# Patient Record
Sex: Female | Born: 1965
Health system: Southern US, Community
[De-identification: ages and names within clinical notes are randomized; demographics above are authoritative.]

## PROBLEM LIST (undated history)

## (undated) DIAGNOSIS — B9689 Other specified bacterial agents as the cause of diseases classified elsewhere: Secondary | ICD-10-CM

## (undated) DIAGNOSIS — R42 Dizziness and giddiness: Secondary | ICD-10-CM

## (undated) DIAGNOSIS — M797 Fibromyalgia: Secondary | ICD-10-CM

## (undated) DIAGNOSIS — Z79899 Other long term (current) drug therapy: Secondary | ICD-10-CM

## (undated) DIAGNOSIS — J45909 Unspecified asthma, uncomplicated: Secondary | ICD-10-CM

## (undated) DIAGNOSIS — Z8619 Personal history of other infectious and parasitic diseases: Secondary | ICD-10-CM

## (undated) DIAGNOSIS — R1031 Right lower quadrant pain: Secondary | ICD-10-CM

## (undated) DIAGNOSIS — N760 Acute vaginitis: Secondary | ICD-10-CM

## (undated) DIAGNOSIS — E039 Hypothyroidism, unspecified: Secondary | ICD-10-CM

## (undated) DIAGNOSIS — N898 Other specified noninflammatory disorders of vagina: Secondary | ICD-10-CM

## (undated) DIAGNOSIS — E559 Vitamin D deficiency, unspecified: Secondary | ICD-10-CM

## (undated) DIAGNOSIS — K5900 Constipation, unspecified: Secondary | ICD-10-CM

## (undated) DIAGNOSIS — R319 Hematuria, unspecified: Secondary | ICD-10-CM

## (undated) DIAGNOSIS — R300 Dysuria: Secondary | ICD-10-CM

## (undated) DIAGNOSIS — N6009 Solitary cyst of unspecified breast: Secondary | ICD-10-CM

## (undated) DIAGNOSIS — K589 Irritable bowel syndrome without diarrhea: Secondary | ICD-10-CM

## (undated) DIAGNOSIS — T7840XA Allergy, unspecified, initial encounter: Secondary | ICD-10-CM

## (undated) DIAGNOSIS — B001 Herpesviral vesicular dermatitis: Secondary | ICD-10-CM

## (undated) DIAGNOSIS — D219 Benign neoplasm of connective and other soft tissue, unspecified: Secondary | ICD-10-CM

## (undated) DIAGNOSIS — B379 Candidiasis, unspecified: Secondary | ICD-10-CM

## (undated) DIAGNOSIS — R350 Frequency of micturition: Secondary | ICD-10-CM

## (undated) DIAGNOSIS — R102 Pelvic and perineal pain: Secondary | ICD-10-CM

## (undated) DIAGNOSIS — F419 Anxiety disorder, unspecified: Secondary | ICD-10-CM

## (undated) HISTORY — PX: TUBAL LIGATION: SHX77

## (undated) HISTORY — DX: Frequency of micturition: R35.0

## (undated) HISTORY — DX: Unspecified asthma, uncomplicated: J45.909

## (undated) HISTORY — DX: Right lower quadrant pain: R10.31

## (undated) HISTORY — DX: Irritable bowel syndrome, unspecified: K58.9

## (undated) HISTORY — DX: Acute vaginitis: N76.0

## (undated) HISTORY — DX: Anxiety disorder, unspecified: F41.9

## (undated) HISTORY — DX: Hematuria, unspecified: R31.9

## (undated) HISTORY — DX: Other specified bacterial agents as the cause of diseases classified elsewhere: B96.89

## (undated) HISTORY — DX: Personal history of other infectious and parasitic diseases: Z86.19

## (undated) HISTORY — DX: Dysuria: R30.0

## (undated) HISTORY — DX: Allergy, unspecified, initial encounter: T78.40XA

## (undated) HISTORY — DX: Benign neoplasm of connective and other soft tissue, unspecified: D21.9

## (undated) HISTORY — DX: Hypothyroidism, unspecified: E03.9

## (undated) HISTORY — DX: Herpesviral vesicular dermatitis: B00.1

## (undated) HISTORY — DX: Vitamin D deficiency, unspecified: E55.9

## (undated) HISTORY — PX: TONSILLECTOMY: SUR1361

## (undated) HISTORY — DX: Dizziness and giddiness: R42

## (undated) HISTORY — DX: Constipation, unspecified: K59.00

## (undated) HISTORY — DX: Candidiasis, unspecified: B37.9

## (undated) HISTORY — DX: Pelvic and perineal pain: R10.2

## (undated) HISTORY — DX: Fibromyalgia: M79.7

## (undated) HISTORY — DX: Solitary cyst of unspecified breast: N60.09

## (undated) HISTORY — DX: Other specified noninflammatory disorders of vagina: N89.8

## (undated) HISTORY — PX: ABDOMINAL HYSTERECTOMY: SHX81

## (undated) HISTORY — DX: Other long term (current) drug therapy: Z79.899

---

## 2000-11-13 ENCOUNTER — Other Ambulatory Visit: Admission: RE | Admit: 2000-11-13 | Discharge: 2000-11-13 | Payer: Self-pay | Admitting: Obstetrics and Gynecology

## 2001-07-09 ENCOUNTER — Encounter: Payer: Self-pay | Admitting: Obstetrics and Gynecology

## 2001-07-09 ENCOUNTER — Ambulatory Visit (HOSPITAL_COMMUNITY): Admission: RE | Admit: 2001-07-09 | Discharge: 2001-07-09 | Payer: Self-pay | Admitting: Internal Medicine

## 2003-01-27 ENCOUNTER — Ambulatory Visit (HOSPITAL_COMMUNITY): Admission: RE | Admit: 2003-01-27 | Discharge: 2003-01-27 | Payer: Self-pay | Admitting: Obstetrics and Gynecology

## 2005-11-29 ENCOUNTER — Ambulatory Visit (HOSPITAL_COMMUNITY): Admission: RE | Admit: 2005-11-29 | Discharge: 2005-11-29 | Payer: Self-pay | Admitting: Obstetrics and Gynecology

## 2007-02-27 ENCOUNTER — Other Ambulatory Visit: Admission: RE | Admit: 2007-02-27 | Discharge: 2007-02-27 | Payer: Self-pay | Admitting: Obstetrics and Gynecology

## 2008-07-22 ENCOUNTER — Encounter: Admission: RE | Admit: 2008-07-22 | Discharge: 2008-07-22 | Payer: Self-pay | Admitting: Obstetrics & Gynecology

## 2008-08-30 ENCOUNTER — Emergency Department (HOSPITAL_COMMUNITY): Admission: EM | Admit: 2008-08-30 | Discharge: 2008-08-30 | Payer: Self-pay | Admitting: Emergency Medicine

## 2009-05-12 ENCOUNTER — Encounter: Admission: RE | Admit: 2009-05-12 | Discharge: 2009-05-12 | Payer: Self-pay | Admitting: Obstetrics & Gynecology

## 2009-08-19 ENCOUNTER — Telehealth (INDEPENDENT_AMBULATORY_CARE_PROVIDER_SITE_OTHER): Payer: Self-pay | Admitting: *Deleted

## 2009-09-23 ENCOUNTER — Emergency Department: Payer: Self-pay | Admitting: Emergency Medicine

## 2009-10-25 ENCOUNTER — Ambulatory Visit (HOSPITAL_COMMUNITY): Admission: RE | Admit: 2009-10-25 | Discharge: 2009-10-25 | Payer: Self-pay | Admitting: Obstetrics & Gynecology

## 2009-11-24 ENCOUNTER — Ambulatory Visit (HOSPITAL_COMMUNITY): Admission: RE | Admit: 2009-11-24 | Discharge: 2009-11-24 | Payer: Self-pay | Admitting: Obstetrics & Gynecology

## 2010-03-08 ENCOUNTER — Encounter (HOSPITAL_COMMUNITY): Admission: RE | Admit: 2010-03-08 | Discharge: 2010-04-07 | Payer: Self-pay | Admitting: Preventative Medicine

## 2010-06-14 NOTE — Progress Notes (Signed)
  Phone Note Call from Patient   Caller: Mary Oconnell Summary of Call: patient called yesterday 08/18/09  to set up appt. She has never been seen in our office due to 3 cancellations and 1 no show.Marland Kitchen l called patient back today and left her a message explaining to her that she needs to speak to her pcp to see if she needs this appt still and if so that we would need another referrall from him.  Initial call taken by: ginger urshel

## 2010-08-24 LAB — COMPREHENSIVE METABOLIC PANEL
ALT: 10 U/L (ref 0–35)
AST: 12 U/L (ref 0–37)
Albumin: 4 g/dL (ref 3.5–5.2)
Alkaline Phosphatase: 58 U/L (ref 39–117)
BUN: 18 mg/dL (ref 6–23)
CO2: 24 mEq/L (ref 19–32)
Calcium: 9.8 mg/dL (ref 8.4–10.5)
Chloride: 107 mEq/L (ref 96–112)
Creatinine, Ser: 0.88 mg/dL (ref 0.4–1.2)
GFR calc Af Amer: >60 mL/min (ref >60)
Glucose, Bld: 117 mg/dL — ABNORMAL HIGH (ref 70–99)

## 2010-08-24 LAB — CBC
HCT: 42.5 % (ref 36.0–46.0)
MCHC: 35.4 g/dL (ref 30.0–36.0)
RDW: 12.4 % (ref 11.5–15.5)
WBC: 7.7 10*3/uL (ref 4.0–10.5)

## 2010-08-24 LAB — DIFFERENTIAL
Basophils Absolute: 0 10*3/uL (ref 0.0–0.1)
Eosinophils Absolute: 0.1 10*3/uL (ref 0.0–0.7)
Lymphs Abs: 2.5 10*3/uL (ref 0.7–4.0)
Monocytes Relative: 10 % (ref 3–12)
Neutro Abs: 4.2 10*3/uL (ref 1.7–7.7)
Neutrophils Relative %: 55 % (ref 43–77)

## 2010-08-29 ENCOUNTER — Other Ambulatory Visit (HOSPITAL_COMMUNITY): Payer: Self-pay | Admitting: Pediatrics

## 2010-08-29 DIAGNOSIS — R1011 Right upper quadrant pain: Secondary | ICD-10-CM

## 2010-08-30 ENCOUNTER — Ambulatory Visit (HOSPITAL_COMMUNITY)
Admission: RE | Admit: 2010-08-30 | Discharge: 2010-08-30 | Disposition: A | Discharge status: Home / Self Care | Payer: BC Managed Care – PPO | Source: Ambulatory Visit | Attending: Pediatrics | Admitting: Pediatrics

## 2010-08-30 DIAGNOSIS — R1011 Right upper quadrant pain: Secondary | ICD-10-CM

## 2010-12-26 ENCOUNTER — Other Ambulatory Visit: Payer: Self-pay | Admitting: Adult Health

## 2010-12-26 DIAGNOSIS — Z1231 Encounter for screening mammogram for malignant neoplasm of breast: Secondary | ICD-10-CM

## 2010-12-30 ENCOUNTER — Ambulatory Visit
Admission: RE | Admit: 2010-12-30 | Discharge: 2010-12-30 | Disposition: A | Discharge status: Home / Self Care | Payer: BC Managed Care – PPO | Source: Ambulatory Visit | Attending: Adult Health | Admitting: Adult Health

## 2010-12-30 DIAGNOSIS — Z1231 Encounter for screening mammogram for malignant neoplasm of breast: Secondary | ICD-10-CM

## 2011-01-04 ENCOUNTER — Other Ambulatory Visit: Payer: Self-pay | Admitting: Adult Health

## 2011-01-04 DIAGNOSIS — R928 Other abnormal and inconclusive findings on diagnostic imaging of breast: Secondary | ICD-10-CM

## 2011-01-05 ENCOUNTER — Ambulatory Visit: Payer: BC Managed Care – PPO

## 2011-01-11 ENCOUNTER — Ambulatory Visit
Admission: RE | Admit: 2011-01-11 | Discharge: 2011-01-11 | Disposition: A | Discharge status: Home / Self Care | Payer: BC Managed Care – PPO | Source: Ambulatory Visit | Attending: Adult Health | Admitting: Adult Health

## 2011-01-11 DIAGNOSIS — R928 Other abnormal and inconclusive findings on diagnostic imaging of breast: Secondary | ICD-10-CM

## 2011-03-09 ENCOUNTER — Other Ambulatory Visit: Payer: Self-pay | Admitting: Obstetrics & Gynecology

## 2011-03-09 DIAGNOSIS — N6009 Solitary cyst of unspecified breast: Secondary | ICD-10-CM

## 2011-03-22 ENCOUNTER — Ambulatory Visit
Admission: RE | Admit: 2011-03-22 | Discharge: 2011-03-22 | Disposition: A | Discharge status: Home / Self Care | Payer: BC Managed Care – PPO | Source: Ambulatory Visit | Attending: Obstetrics & Gynecology | Admitting: Obstetrics & Gynecology

## 2011-03-22 DIAGNOSIS — N6009 Solitary cyst of unspecified breast: Secondary | ICD-10-CM

## 2011-12-13 ENCOUNTER — Other Ambulatory Visit: Payer: Self-pay | Admitting: Obstetrics and Gynecology

## 2011-12-13 DIAGNOSIS — Z1231 Encounter for screening mammogram for malignant neoplasm of breast: Secondary | ICD-10-CM

## 2012-01-01 ENCOUNTER — Ambulatory Visit: Payer: BC Managed Care – PPO

## 2012-07-10 ENCOUNTER — Other Ambulatory Visit: Payer: Self-pay

## 2012-07-10 DIAGNOSIS — Z1231 Encounter for screening mammogram for malignant neoplasm of breast: Secondary | ICD-10-CM

## 2012-07-22 ENCOUNTER — Ambulatory Visit
Admission: RE | Admit: 2012-07-22 | Discharge: 2012-07-22 | Disposition: A | Discharge status: Home / Self Care | Payer: BC Managed Care – PPO | Source: Ambulatory Visit

## 2012-07-22 DIAGNOSIS — Z1231 Encounter for screening mammogram for malignant neoplasm of breast: Secondary | ICD-10-CM

## 2012-08-13 ENCOUNTER — Other Ambulatory Visit: Payer: Self-pay | Admitting: Obstetrics & Gynecology

## 2012-08-13 MED ORDER — LORAZEPAM 0.5 MG PO TABS: 0.5000 mg | ORAL_TABLET | Freq: Three times a day (TID) | ORAL | Status: DC

## 2012-08-13 NOTE — Telephone Encounter (Signed)
Patient requested a refill on her Ativan, appproved

## 2012-08-26 ENCOUNTER — Encounter: Payer: Self-pay | Admitting: Adult Health

## 2012-08-26 ENCOUNTER — Ambulatory Visit (INDEPENDENT_AMBULATORY_CARE_PROVIDER_SITE_OTHER): Payer: BC Managed Care – PPO | Admitting: Adult Health

## 2012-08-26 VITALS — BP 110/66 | Ht 65.0 in | Wt 135.0 lb

## 2012-08-26 DIAGNOSIS — F411 Generalized anxiety disorder: Secondary | ICD-10-CM

## 2012-08-26 DIAGNOSIS — N63 Unspecified lump in unspecified breast: Secondary | ICD-10-CM

## 2012-08-26 DIAGNOSIS — Z9223 Personal history of estrogen therapy: Secondary | ICD-10-CM

## 2012-08-26 DIAGNOSIS — F419 Anxiety disorder, unspecified: Secondary | ICD-10-CM

## 2012-08-26 NOTE — Progress Notes (Signed)
Subjective:     Patient ID: Mary Oconnell, female   DOB: 12/29/1965, 47 y.o.   MRN: [id number]  Mary Oconnell is a 47 year old white female in complaining of right breast mass.   Review of SystemsPt. Complains of right breast mass and nodule under arm. Reviewed past medical, surgical, social and family history. Reviewed medications and allergies. Taking ativan, doing ok. Using estrogen patches. Had mammogram in March was fine then.     Objective:   Physical Exam  Blood pressure 110/66, height 5\' 5"  (1.651 m), weight 135 lb (61.236 kg).  Skin warm and dry and tan. Left breast: No dominant mass, retraction or nipple discharge. Right breast: No retraction no nipple discharge but there is a smooth mobile nodule at 12:00 2 fingerbreadths from nipple, it was tender with palpation. She also has an area under her right arm that's tender, questionable lymph node.    Assessment:      Right breast nodule   Anxiety Estrogen therapy Plan:      Scheduled diagnostic right mammogram and right breast ultrasound  4/25 at 9:50 am at the [facility] in [place]. Will follow up by phone after tests, leave breast alone.

## 2012-08-26 NOTE — Patient Instructions (Addendum)
Breast ultrasound and mammogram 4 /25@ 9:50 adm at breast center in [place]. Leave breast alone  Sign up for my chart

## 2012-09-06 ENCOUNTER — Ambulatory Visit
Admission: RE | Admit: 2012-09-06 | Discharge: 2012-09-06 | Disposition: A | Discharge status: Home / Self Care | Payer: BC Managed Care – PPO | Source: Ambulatory Visit | Attending: Adult Health | Admitting: Adult Health

## 2012-09-06 ENCOUNTER — Other Ambulatory Visit: Payer: Self-pay | Admitting: Adult Health

## 2012-09-06 DIAGNOSIS — N63 Unspecified lump in unspecified breast: Secondary | ICD-10-CM

## 2012-09-18 ENCOUNTER — Other Ambulatory Visit: Payer: Self-pay | Admitting: *Deleted

## 2012-09-18 MED ORDER — ESTRADIOL 0.1 MG/24HR TD PTTW: 1.0000 | MEDICATED_PATCH | TRANSDERMAL | Status: DC

## 2012-10-17 ENCOUNTER — Encounter: Payer: Self-pay | Admitting: *Deleted

## 2012-10-18 ENCOUNTER — Ambulatory Visit: Payer: BC Managed Care – PPO | Admitting: Adult Health

## 2012-11-01 ENCOUNTER — Telehealth: Payer: Self-pay | Admitting: Adult Health

## 2012-11-01 MED ORDER — FLUCONAZOLE 150 MG PO TABS: 150.0000 mg | ORAL_TABLET | Freq: Once | ORAL | Status: DC

## 2012-11-01 NOTE — Telephone Encounter (Signed)
Complains of yeast will call diflucan in to [facility]

## 2012-11-04 ENCOUNTER — Ambulatory Visit: Payer: BC Managed Care – PPO | Admitting: Adult Health

## 2012-12-27 ENCOUNTER — Other Ambulatory Visit: Payer: Self-pay | Admitting: Radiology

## 2012-12-27 ENCOUNTER — Encounter (HOSPITAL_COMMUNITY): Payer: Self-pay

## 2012-12-27 ENCOUNTER — Encounter (HOSPITAL_COMMUNITY)
Admission: RE | Admit: 2012-12-27 | Discharge: 2012-12-27 | Disposition: A | Discharge status: Home / Self Care | Payer: BC Managed Care – PPO | Source: Ambulatory Visit | Attending: Orthopaedic Surgery | Admitting: Orthopaedic Surgery

## 2012-12-27 DIAGNOSIS — Z01818 Encounter for other preprocedural examination: Secondary | ICD-10-CM | POA: Insufficient documentation

## 2012-12-27 DIAGNOSIS — Z01812 Encounter for preprocedural laboratory examination: Secondary | ICD-10-CM | POA: Insufficient documentation

## 2012-12-27 LAB — CBC WITH DIFFERENTIAL/PLATELET
Hemoglobin: 15.2 g/dL — ABNORMAL HIGH (ref 12.0–15.0)
Lymphs Abs: 2.1 10*3/uL (ref 0.7–4.0)
Monocytes Relative: 13 % — ABNORMAL HIGH (ref 3–12)
Neutro Abs: 2.2 10*3/uL (ref 1.7–7.7)
Neutrophils Relative %: 44 % (ref 43–77)
RBC: 4.71 MIL/uL (ref 3.87–5.11)

## 2012-12-27 LAB — URINALYSIS, ROUTINE W REFLEX MICROSCOPIC
Glucose, UA: NEGATIVE mg/dL
Ketones, ur: NEGATIVE mg/dL
Leukocytes, UA: NEGATIVE
Protein, ur: NEGATIVE mg/dL

## 2012-12-27 LAB — COMPREHENSIVE METABOLIC PANEL
Albumin: 3.9 g/dL (ref 3.5–5.2)
Alkaline Phosphatase: 81 U/L (ref 39–117)
BUN: 15 mg/dL (ref 6–23)
Chloride: 105 mEq/L (ref 96–112)
GFR calc Af Amer: 79 mL/min — ABNORMAL LOW (ref >90)
Glucose, Bld: 94 mg/dL (ref 70–99)
Potassium: 4.7 mEq/L (ref 3.5–5.1)
Total Bilirubin: 0.4 mg/dL (ref 0.3–1.2)

## 2012-12-27 NOTE — Patient Instructions (Addendum)
Mary Oconnell  12/27/2012   Your procedure is scheduled on:  12/31/2012  Report to [facility] at  800  AM.  Call this number if you have problems the morning of surgery: [phone]   Remember:   Do not eat food or drink liquids after midnight.   Take these medicines the morning of surgery with A SIP OF WATER: ativan. Take your flonase and your inhaler before you come.   Do not wear jewelry, make-up or nail polish.  Do not wear lotions, powders, or perfumes.   Do not shave 48 hours prior to surgery. Men may shave face and neck.  Do not bring valuables to the hospital.  [facility] is not responsible for any belongings or valuables.  Contacts, dentures or bridgework may not be worn into surgery.  Leave suitcase in the car. After surgery it may be brought to your room.  For patients admitted to the hospital, checkout time is 11:00 AM the day of discharge.   Patients discharged the day of surgery will not be allowed to drive home.  Name and phone number of your driver: family  Special Instructions: Shower using CHG 2 nights before surgery and the night before surgery.  If you shower the day of surgery use CHG.  Use special wash - you have one bottle of CHG for all showers.  You should use approximately 1/3 of the bottle for each shower.   Please read over the following fact sheets that you were given: Pain Booklet, Coughing and Deep Breathing, Surgical Site Infection Prevention, Anesthesia Post-op Instructions and Care and Recovery After Surgery Carpal Tunnel Release Carpal tunnel release is done to relieve the pressure on the nerves and tendons on the bottom side of your wrist.  LET YOUR CAREGIVER KNOW ABOUT:   Allergies to food or medicine.  Medicines taken, including vitamins, herbs, eyedrops, over-the-counter medicines, and creams.  Use of steroids (by mouth or creams).  Previous problems with anesthetics or numbing medicines.  History of bleeding problems or blood  clots.  Previous surgery.  Other health problems, including diabetes and kidney problems.  Possibility of pregnancy, if this applies. RISKS AND COMPLICATIONS  Some problems that may happen after this procedure include:  Infection.  Damage to the nerves, arteries or tendons could occur. This would be very uncommon.  Bleeding. BEFORE THE PROCEDURE   This surgery may be done while you are asleep (general anesthetic) or may be done under a block where only your forearm and the surgical area is numb.  If the surgery is done under a block, the numbness will gradually wear off within several hours after surgery. HOME CARE INSTRUCTIONS   Have a responsible person with you for 24 hours.  Do not drive a car or use public transportation for 24 hours.  Only take over-the-counter or prescription medicines for pain, discomfort, or fever as directed by your caregiver. Take them as directed.  You may put ice on the palm side of the affected wrist.  Put ice in a plastic bag.  Place a towel between your skin and the bag.  Leave the ice on for 20 to 30 minutes, 4 times per day.  If you were given a splint to keep your wrist from bending, use it as directed. It is important to wear the splint at night or as directed. Use the splint for as long as you have pain or numbness in your hand, arm, or wrist. This may take  1 to 2 months.  Keep your hand raised (elevated) above the level of your heart as much as possible. This keeps swelling down and helps with discomfort.  Change bandages (dressings) as directed.  Keep the wound clean and dry. SEEK MEDICAL CARE IF:   You develop pain not relieved with medications.  You develop numbness of your hand.  You develop bleeding from your surgical site.  You have an oral temperature above 102 F (38.9 C).  You develop redness or swelling of the surgical site.  You develop new, unexplained problems. SEEK IMMEDIATE MEDICAL CARE IF:   You develop  a rash.  You have difficulty breathing.  You develop any reaction or side effects to medications given. Document Released: 07/22/2003 Document Revised: 07/24/2011 Document Reviewed: 03/07/2007 [facility] Patient Information 2014 [place], [place]. PATIENT INSTRUCTIONS POST-ANESTHESIA  IMMEDIATELY FOLLOWING SURGERY:  Do not drive or operate machinery for the first twenty four hours after surgery.  Do not make any important decisions for twenty four hours after surgery or while taking narcotic pain medications or sedatives.  If you develop intractable nausea and vomiting or a severe headache please notify your doctor immediately.  FOLLOW-UP:  Please make an appointment with your surgeon as instructed. You do not need to follow up with anesthesia unless specifically instructed to do so.  WOUND CARE INSTRUCTIONS (if applicable):  Keep a dry clean dressing on the anesthesia/puncture wound site if there is drainage.  Once the wound has quit draining you may leave it open to air.  Generally you should leave the bandage intact for twenty four hours unless there is drainage.  If the epidural site drains for more than 36-48 hours please call the anesthesia department.  QUESTIONS?:  Please feel free to call your physician or the hospital operator if you have any questions, and they will be happy to assist you.

## 2012-12-30 ENCOUNTER — Encounter (HOSPITAL_COMMUNITY): Payer: Self-pay | Admitting: Pharmacy Technician

## 2012-12-30 NOTE — H&P (Signed)
Mary Oconnell is an 47 y.o. female.   Chief Complaint: carpal tunnel syndrome left HPI: She has had several month history of pain of both hands, more on the left, of getting numb and tender, more at night.  It involves the long and index fingers and thumb. She has no trauma.  She is not getting better.  She had positive EMG by Dr. Gerilyn Pilgrim showing moderate bilateral median neuropathy of the wrists.  She also has mild underlying demyelinating sensory neuropathy.  I have recommended carpal tunnel release.  I have told her the underlying demyelination condition will not be helped or changed by the procedure.  She understands this is an elective procedure.  She asked appropriate questions.  She has chosen August 19th to have outpatient surgery.  Past Medical History  Diagnosis Date  . Asthma   . Allergy   . Anxiety   . Fibromyalgia   . IBS (irritable bowel syndrome)   . Breast cyst   . Fibroid     Past Surgical History  Procedure Laterality Date  . Abdominal hysterectomy    . Tonsillectomy    . Tubal ligation      Family History  Problem Relation Age of Onset  . Thyroid disease Mother   . Cancer Father     lung  . Cancer Maternal Grandmother 40    breast   . Crohn's disease Sister    Social History:  reports that she has quit smoking. Her smoking use included Cigarettes. She has a 2.5 pack-year smoking history. She quit smokeless tobacco use about 18 years ago. She reports that she does not drink alcohol or use illicit drugs.  Allergies:  Allergies  Allergen Reactions  . Iodine Anaphylaxis  . Shellfish Allergy Anaphylaxis  . Ciprofloxacin Other (See Comments)    Unknown Reaction   . Other Other (See Comments)    Walnuts make patients lips turn blue.  Doylene Bode [Sulfamethoxazole W-Trimethoprim] Hives  . Latex Rash  . Synthroid [Levothyroxine Sodium] Palpitations    No prescriptions prior to admission    No results found for this or any previous visit (from the past 48  hour(s)). No results found.  Review of Systems  Musculoskeletal: Positive for joint pain (Pain both wrists with decreased sensation and night paresthesias.).  All other systems reviewed and are negative.    There were no vitals taken for this visit. Physical Exam  Constitutional: She is oriented to person, place, and time. She appears well-developed and well-nourished.  HENT:  Head: Normocephalic and atraumatic.  Eyes: EOM are normal. Pupils are equal, round, and reactive to light.  Neck: Normal range of motion. Neck supple.  Cardiovascular: Normal rate, regular rhythm, normal heart sounds and intact distal pulses.   Respiratory: Effort normal and breath sounds normal.  GI: Soft. Bowel sounds are normal.  Musculoskeletal: She exhibits tenderness (Both wrists with volar tenderness and positive Tinel and Phalen signs.  Left more than the right.).  Neurological: She is alert and oriented to person, place, and time. She has normal reflexes.  Skin: Skin is warm and dry.  Psychiatric: She has a normal mood and affect. Her behavior is normal. Judgment and thought content normal.     Assessment/Plan Carpal tunnel syndrome, bilaterally, worse on the left than the right.  To do open left carpal tunnel release.  Christinna Sprung 12/30/2012, 1:48 PM

## 2012-12-31 ENCOUNTER — Encounter (HOSPITAL_COMMUNITY): Payer: Self-pay | Admitting: Anesthesiology

## 2012-12-31 ENCOUNTER — Encounter (HOSPITAL_COMMUNITY): Payer: Self-pay | Admitting: *Deleted

## 2012-12-31 ENCOUNTER — Ambulatory Visit (HOSPITAL_COMMUNITY): Payer: BC Managed Care – PPO | Admitting: Anesthesiology

## 2012-12-31 ENCOUNTER — Ambulatory Visit (HOSPITAL_COMMUNITY)
Admission: RE | Admit: 2012-12-31 | Discharge: 2012-12-31 | Disposition: A | Discharge status: Home / Self Care | Payer: BC Managed Care – PPO | Source: Ambulatory Visit | Attending: Orthopaedic Surgery | Admitting: Orthopaedic Surgery

## 2012-12-31 ENCOUNTER — Encounter (HOSPITAL_COMMUNITY)
Admission: RE | Disposition: A | Discharge status: Home / Self Care | Payer: Self-pay | Source: Ambulatory Visit | Attending: Orthopaedic Surgery

## 2012-12-31 DIAGNOSIS — Z91041 Radiographic dye allergy status: Secondary | ICD-10-CM | POA: Insufficient documentation

## 2012-12-31 DIAGNOSIS — Z91040 Latex allergy status: Secondary | ICD-10-CM | POA: Insufficient documentation

## 2012-12-31 DIAGNOSIS — G5600 Carpal tunnel syndrome, unspecified upper limb: Secondary | ICD-10-CM | POA: Insufficient documentation

## 2012-12-31 DIAGNOSIS — Z87891 Personal history of nicotine dependence: Secondary | ICD-10-CM | POA: Insufficient documentation

## 2012-12-31 DIAGNOSIS — Z882 Allergy status to sulfonamides status: Secondary | ICD-10-CM | POA: Insufficient documentation

## 2012-12-31 DIAGNOSIS — N6009 Solitary cyst of unspecified breast: Secondary | ICD-10-CM | POA: Insufficient documentation

## 2012-12-31 DIAGNOSIS — Z91018 Allergy to other foods: Secondary | ICD-10-CM | POA: Insufficient documentation

## 2012-12-31 DIAGNOSIS — Z888 Allergy status to other drugs, medicaments and biological substances status: Secondary | ICD-10-CM | POA: Insufficient documentation

## 2012-12-31 DIAGNOSIS — J45909 Unspecified asthma, uncomplicated: Secondary | ICD-10-CM | POA: Insufficient documentation

## 2012-12-31 DIAGNOSIS — Z883 Allergy status to other anti-infective agents status: Secondary | ICD-10-CM | POA: Insufficient documentation

## 2012-12-31 DIAGNOSIS — K589 Irritable bowel syndrome without diarrhea: Secondary | ICD-10-CM | POA: Insufficient documentation

## 2012-12-31 DIAGNOSIS — IMO0001 Reserved for inherently not codable concepts without codable children: Secondary | ICD-10-CM | POA: Insufficient documentation

## 2012-12-31 DIAGNOSIS — F411 Generalized anxiety disorder: Secondary | ICD-10-CM | POA: Insufficient documentation

## 2012-12-31 DIAGNOSIS — Z91013 Allergy to seafood: Secondary | ICD-10-CM | POA: Insufficient documentation

## 2012-12-31 HISTORY — PX: CARPAL TUNNEL RELEASE: SHX101

## 2012-12-31 SURGERY — CARPAL TUNNEL RELEASE
Anesthesia: Monitor Anesthesia Care | Site: Wrist | Laterality: Left | Wound class: Clean

## 2012-12-31 MED ORDER — PROPOFOL 10 MG/ML IV EMUL
INTRAVENOUS | Status: AC
  Filled 2012-12-31: qty 20

## 2012-12-31 MED ORDER — ONDANSETRON HCL 4 MG/2ML IJ SOLN
4.0000 mg | Freq: Once | INTRAMUSCULAR | Status: AC
  Administered 2012-12-31: 4 mg via INTRAVENOUS

## 2012-12-31 MED ORDER — ONDANSETRON HCL 4 MG/2ML IJ SOLN
INTRAMUSCULAR | Status: AC
  Filled 2012-12-31: qty 2

## 2012-12-31 MED ORDER — FENTANYL CITRATE 0.05 MG/ML IJ SOLN
INTRAMUSCULAR | Status: AC
  Filled 2012-12-31: qty 2

## 2012-12-31 MED ORDER — FENTANYL CITRATE 0.05 MG/ML IJ SOLN
25.0000 ug | INTRAMUSCULAR | Status: AC
  Administered 2012-12-31 (×2): 25 ug via INTRAVENOUS

## 2012-12-31 MED ORDER — MIDAZOLAM HCL 2 MG/2ML IJ SOLN
1.0000 mg | INTRAMUSCULAR | Status: DC | PRN
  Administered 2012-12-31: 2 mg via INTRAVENOUS

## 2012-12-31 MED ORDER — FENTANYL CITRATE 0.05 MG/ML IJ SOLN
25.0000 ug | INTRAMUSCULAR | Status: DC | PRN
  Administered 2012-12-31: 50 ug via INTRAVENOUS
  Administered 2012-12-31: 25 ug via INTRAVENOUS

## 2012-12-31 MED ORDER — SODIUM CHLORIDE 0.9 % IR SOLN
Status: DC | PRN
  Administered 2012-12-31: 1000 mL

## 2012-12-31 MED ORDER — SODIUM CHLORIDE 0.9 % IJ SOLN
INTRAMUSCULAR | Status: AC
  Filled 2012-12-31: qty 10

## 2012-12-31 MED ORDER — SODIUM CHLORIDE 0.9 % IJ SOLN
INTRAMUSCULAR | Status: DC | PRN
  Administered 2012-12-31: 3 mL

## 2012-12-31 MED ORDER — PROPOFOL INFUSION 10 MG/ML OPTIME
INTRAVENOUS | Status: DC | PRN
  Administered 2012-12-31: 75 ug/kg/min via INTRAVENOUS

## 2012-12-31 MED ORDER — LIDOCAINE HCL (PF) 1 % IJ SOLN
INTRAMUSCULAR | Status: AC
  Filled 2012-12-31: qty 5

## 2012-12-31 MED ORDER — LACTATED RINGERS IV SOLN
INTRAVENOUS | Status: DC
  Administered 2012-12-31: 1000 mL via INTRAVENOUS

## 2012-12-31 MED ORDER — LIDOCAINE HCL (CARDIAC) 20 MG/ML IV SOLN
INTRAVENOUS | Status: DC | PRN
  Administered 2012-12-31: 20 mg via INTRAVENOUS

## 2012-12-31 MED ORDER — FENTANYL CITRATE 0.05 MG/ML IJ SOLN
INTRAMUSCULAR | Status: DC | PRN
  Administered 2012-12-31 (×2): 25 ug via INTRAVENOUS

## 2012-12-31 MED ORDER — MIDAZOLAM HCL 2 MG/2ML IJ SOLN
INTRAMUSCULAR | Status: AC
  Filled 2012-12-31: qty 2

## 2012-12-31 MED ORDER — PROMETHAZINE HCL 25 MG/ML IJ SOLN
6.2500 mg | Freq: Once | INTRAMUSCULAR | Status: AC
  Administered 2012-12-31: 6.25 mg via INTRAVENOUS

## 2012-12-31 MED ORDER — LIDOCAINE HCL (PF) 0.5 % IJ SOLN
INTRAMUSCULAR | Status: DC | PRN
  Administered 2012-12-31: 50 mL via INTRAVENOUS

## 2012-12-31 MED ORDER — CHLORHEXIDINE GLUCONATE 4 % EX LIQD: 60.0000 mL | Freq: Once | CUTANEOUS | Status: DC

## 2012-12-31 MED ORDER — MIDAZOLAM HCL 5 MG/5ML IJ SOLN
INTRAMUSCULAR | Status: DC | PRN
  Administered 2012-12-31 (×2): 1 mg via INTRAVENOUS

## 2012-12-31 MED ORDER — HYDROCODONE-ACETAMINOPHEN 7.5-325 MG PO TABS: 1.0000 | ORAL_TABLET | ORAL | Status: DC | PRN

## 2012-12-31 MED ORDER — ONDANSETRON HCL 4 MG/2ML IJ SOLN
4.0000 mg | Freq: Once | INTRAMUSCULAR | Status: AC | PRN
  Administered 2012-12-31: 4 mg via INTRAVENOUS

## 2012-12-31 MED ORDER — PROMETHAZINE HCL 25 MG/ML IJ SOLN
INTRAMUSCULAR | Status: AC
  Filled 2012-12-31: qty 1

## 2012-12-31 SURGICAL SUPPLY — 38 items
BAG HAMPER (MISCELLANEOUS) ×2 IMPLANT
BANDAGE ELASTIC 3 VELCRO NS (GAUZE/BANDAGES/DRESSINGS) ×2 IMPLANT
BANDAGE ESMARK 4X12 BL STRL LF (DISPOSABLE) ×1 IMPLANT
BLADE SURG 15 STRL LF DISP TIS (BLADE) ×1 IMPLANT
BLADE SURG 15 STRL SS (BLADE) ×2
BNDG CMPR 12X4 ELC STRL LF (DISPOSABLE) ×1
BNDG ESMARK 4X12 BLUE STRL LF (DISPOSABLE) ×2
CLOTH BEACON ORANGE TIMEOUT ST (SAFETY) ×2 IMPLANT
COVER LIGHT HANDLE STERIS (MISCELLANEOUS) ×4 IMPLANT
CUFF TOURNIQUET SINGLE 18IN (TOURNIQUET CUFF) ×2 IMPLANT
DRSG XEROFORM 1X8 (GAUZE/BANDAGES/DRESSINGS) ×1 IMPLANT
DURAPREP 26ML APPLICATOR (WOUND CARE) ×2 IMPLANT
ELECT REM PT RETURN 9FT ADLT (ELECTROSURGICAL) ×2
ELECTRODE REM PT RTRN 9FT ADLT (ELECTROSURGICAL) ×1 IMPLANT
FORMALIN 10 PREFIL 120ML (MISCELLANEOUS) ×2 IMPLANT
GLOVE BIOGEL PI IND STRL 7.0 (GLOVE) IMPLANT
GLOVE BIOGEL PI INDICATOR 7.0 (GLOVE) ×2
GLOVE SKINSENSE NS SZ6.5 (GLOVE) ×1
GLOVE SKINSENSE NS SZ8.0 LF (GLOVE) ×1
GLOVE SKINSENSE STRL SZ6.5 (GLOVE) IMPLANT
GLOVE SKINSENSE STRL SZ8.0 LF (GLOVE) IMPLANT
GLOVE SS N UNI LF 8.5 STRL (GLOVE) ×1 IMPLANT
GOWN STRL REIN XL XLG (GOWN DISPOSABLE) ×4 IMPLANT
KIT ROOM TURNOVER APOR (KITS) ×2 IMPLANT
NDL HYPO 18GX1.5 BLUNT FILL (NEEDLE) ×1 IMPLANT
NDL HYPO 27GX1-1/4 (NEEDLE) ×1 IMPLANT
NEEDLE HYPO 18GX1.5 BLUNT FILL (NEEDLE) ×2 IMPLANT
NEEDLE HYPO 27GX1-1/4 (NEEDLE) ×2 IMPLANT
NS IRRIG 1000ML POUR BTL (IV SOLUTION) ×2 IMPLANT
PACK BASIC LIMB (CUSTOM PROCEDURE TRAY) ×2 IMPLANT
PAD ARMBOARD 7.5X6 YLW CONV (MISCELLANEOUS) ×2 IMPLANT
PAD CAST 3X4 CTTN HI CHSV (CAST SUPPLIES) ×1 IMPLANT
PADDING CAST COTTON 3X4 STRL (CAST SUPPLIES) ×2
SET BASIN LINEN APH (SET/KITS/TRAYS/PACK) ×2 IMPLANT
SPONGE GAUZE 4X4 12PLY (GAUZE/BANDAGES/DRESSINGS) ×2 IMPLANT
SUT ETHILON 3 0 FSL (SUTURE) ×2 IMPLANT
SYR 3ML LL SCALE MARK (SYRINGE) ×2 IMPLANT
VESSEL LOOPS MAXI RED (MISCELLANEOUS) ×2 IMPLANT

## 2012-12-31 NOTE — Anesthesia Preprocedure Evaluation (Signed)
Anesthesia Evaluation  Patient identified by MRN, date of birth, ID band Patient awake    Reviewed: Allergy & Precautions, H&P , NPO status , Patient's Chart, lab work & pertinent test results  Airway Mallampati: II TM Distance: >3 FB Neck ROM: Full    Dental  (+) Edentulous Upper   Pulmonary asthma ,  breath sounds clear to auscultation        Cardiovascular negative cardio ROS  Rhythm:Regular Rate:Normal     Neuro/Psych Anxiety    GI/Hepatic IBS hx    Endo/Other    Renal/GU      Musculoskeletal  (+) Fibromyalgia -  Abdominal   Peds  Hematology   Anesthesia Other Findings   Reproductive/Obstetrics                           Anesthesia Physical Anesthesia Plan  ASA: II  Anesthesia Plan: Bier Block and MAC   Post-op Pain Management:    Induction: Intravenous  Airway Management Planned: Nasal Cannula  Additional Equipment:   Intra-op Plan:   Post-operative Plan:   Informed Consent: I have reviewed the patients History and Physical, chart, labs and discussed the procedure including the risks, benefits and alternatives for the proposed anesthesia with the patient or authorized representative who has indicated his/her understanding and acceptance.     Plan Discussed with:   Anesthesia Plan Comments:         Anesthesia Quick Evaluation

## 2012-12-31 NOTE — Anesthesia Postprocedure Evaluation (Signed)
  Anesthesia Post-op Note  Patient: Mary Oconnell  Procedure(s) Performed: Procedure(s): LEFT CARPAL TUNNEL RELEASE (Left)  Patient Location: PACU  Anesthesia Type:Bier block  Level of Consciousness: awake, alert  and oriented  Airway and Oxygen Therapy: Patient Spontanous Breathing and Patient connected to nasal cannula oxygen  Post-op Pain: mild  Post-op Assessment: Post-op Vital signs reviewed, Patient's Cardiovascular Status Stable, Respiratory Function Stable, Patent Airway and No signs of Nausea or vomiting  Post-op Vital Signs: Reviewed and stable  Complications: No apparent anesthesia complications

## 2012-12-31 NOTE — Brief Op Note (Signed)
12/31/2012  9:47 AM  PATIENT:  Newman Nip  47 y.o. female  PRE-OPERATIVE DIAGNOSIS:  carpal tunnel syndrome left wrist  POST-OPERATIVE DIAGNOSIS:  carpal tunnel syndrome left wrist  PROCEDURE:  Procedure(s): CARPAL TUNNEL RELEASE (Left)  SURGEON:  Surgeon(s) and Role:    * Darreld Mclean, MD - Primary  PHYSICIAN ASSISTANT:   ASSISTANTS: none   ANESTHESIA:   regional  EBL:  Total I/O In: 400 [I.V.:400] Out: 0   BLOOD ADMINISTERED:none  DRAINS: none   LOCAL MEDICATIONS USED:  NONE  SPECIMEN:  Source of Specimen:  left volar carpal ligament  DISPOSITION OF SPECIMEN:  PATHOLOGY  COUNTS:  YES  TOURNIQUET:   Total Tourniquet Time Documented: Upper Arm (Left) - -44620 minutes Total: Upper Arm (Left) - -44620 minutes   DICTATION: .Other Dictation: Dictation Number [id number]  PLAN OF CARE: Discharge to home after PACU  PATIENT DISPOSITION:  PACU - hemodynamically stable.   Delay start of Pharmacological VTE agent (>24hrs) due to surgical blood loss or risk of bleeding: not applicable

## 2012-12-31 NOTE — Progress Notes (Signed)
The History and Physical is unchanged. I have examined the patient. The patient is medically able to have surgery on the left hand . Darreld Mclean

## 2012-12-31 NOTE — Anesthesia Procedure Notes (Signed)
Anesthesia Regional Block:  Bier block (IV Regional)  Pre-Anesthetic Checklist: ,, timeout performed, Correct Patient, Correct Site, Correct Laterality, Correct Procedure,, site marked, surgical consent,, at surgeon's request Needles:  Injection technique: Single-shot  Needle Type: Other      Needle Gauge: 22 and 22 G    Additional Needles: Bier block (IV Regional)  Nerve Stimulator or Paresthesia:   Additional Responses:  Pulse checked post tourniquet inflation. IV NSL discontinued post injection. Narrative:   Performed by: Personally   Bier block (IV Regional)   

## 2012-12-31 NOTE — Transfer of Care (Signed)
Immediate Anesthesia Transfer of Care Note  Patient: Mary Oconnell  Procedure(s) Performed: Procedure(s): LEFT CARPAL TUNNEL RELEASE (Left)  Patient Location: PACU  Anesthesia Type:Bier block  Level of Consciousness: awake, alert  and oriented  Airway & Oxygen Therapy: Patient Spontanous Breathing  Post-op Assessment: Report given to PACU RN  Post vital signs: Reviewed  Complications: No apparent anesthesia complications

## 2013-01-01 ENCOUNTER — Encounter (HOSPITAL_COMMUNITY): Payer: Self-pay | Admitting: Orthopaedic Surgery

## 2013-01-01 NOTE — Op Note (Signed)
Mary Oconnell, Mary Oconnell                 ACCOUNT NO.:  [id number]  MEDICAL RECORD NO.:  [id number]  LOCATION:  [facility]                          FACILITY:  [facility]  PHYSICIAN:  J. Darreld Mclean, M.D. DATE OF BIRTH:  1965-07-28  DATE OF PROCEDURE: DATE OF DISCHARGE:                              OPERATIVE REPORT   PREOPERATIVE DIAGNOSIS:  Carpal tunnel left.  POSTOPERATIVE DIAGNOSIS:  Carpal tunnel left.  PROCEDURE:  Open release of volar carpal ligament, saline neurolysis, epineurotomy, left median nerve.  ANESTHESIA:  Regional.  TOURNIQUET TIME:  Please refer anesthesia record.  DRAINS:  No drains.  SPECIMENS:  Carpal ligament specimen.  SURGEON:  J. Darreld Mclean, M.D.  INDICATION:  The patient has had increasing pain and tenderness involving numbness in the median nerve distribution, particularly at night and after holding her hand for long periods of time such as a steering wheel.  She has gotten progressively worse.  EMGs were positive for moderate carpal tunnel on both hands.  She is not responding well to conservative treatment.  Surgery has been recommended.  Risks and imponderables have been discussed with the patient.  She appears to understand and agreed to the procedure as outlined.  She understands this is an elective outpatient surgery, and she has elected to have the surgery done today.  DESCRIPTION OF PROCEDURE:  The patient was seen in the holding area. The left hand was identified as the correct surgical site.  I placed a mark over the left volar wrist area.  She was brought to the operating room, placed supine.  Hand table was attached.  She was given Bier block anesthesia.  She was then prepped and draped in usual manner.  A generalized time-out identifying the patient as Ms. Much and she is here to have surgery on her left hand for carpal tunnel release.  All instrumentation was properly positioned and working.  The OR team knew each other.  Outline for  incision was made.  Incision was made.  The median nerve was identified proximally.  Vessel loop placed around the median nerve. A groove director was placed within the carpal tunnel space.  The volar carpal Ligament was incised.  There was obvious compression on the nerve. Retinaculum cut proximally.  Saline neurolysis carried out.  The median nerve was inspected and no apparent injury was present.The wound then reapproximated using 3-0 nylon interrupted vertical mattress manner.  Sterile dressing applied.  Bulky dressing applied. ACE bandage applied loosely.  The patient tolerated the procedure well, and will go home after the procedure.  Prescription for analgesia has been given.  If any difficulties, contact me through the office hospital beeper system.  I will see her in the office in 1 week.          ______________________________ J. Darreld Mclean, M.D.     JWK/MEDQ  D:  12/31/2012  T:  12/31/2012  Job:  [id number]

## 2013-02-10 ENCOUNTER — Encounter: Payer: Self-pay | Admitting: Adult Health

## 2013-02-10 ENCOUNTER — Ambulatory Visit (INDEPENDENT_AMBULATORY_CARE_PROVIDER_SITE_OTHER): Payer: BC Managed Care – PPO | Admitting: Adult Health

## 2013-02-10 VITALS — BP 100/62 | HR 72 | Ht 65.0 in | Wt 144.0 lb

## 2013-02-10 DIAGNOSIS — Z01419 Encounter for gynecological examination (general) (routine) without abnormal findings: Secondary | ICD-10-CM

## 2013-02-10 DIAGNOSIS — F419 Anxiety disorder, unspecified: Secondary | ICD-10-CM

## 2013-02-10 DIAGNOSIS — Z9223 Personal history of estrogen therapy: Secondary | ICD-10-CM

## 2013-02-10 DIAGNOSIS — Z1212 Encounter for screening for malignant neoplasm of rectum: Secondary | ICD-10-CM

## 2013-02-10 LAB — HEMOCCULT GUIAC POC 1CARD (OFFICE): Fecal Occult Blood, POC: NEGATIVE

## 2013-02-10 MED ORDER — IBUPROFEN 800 MG PO TABS: 800.0000 mg | ORAL_TABLET | Freq: Three times a day (TID) | ORAL | Status: DC | PRN

## 2013-02-10 MED ORDER — ESTRADIOL 0.1 MG/24HR TD PTTW: 1.0000 | MEDICATED_PATCH | TRANSDERMAL | Status: DC

## 2013-02-10 NOTE — Patient Instructions (Addendum)
Physical in 1 year Mammogram yearly Colonoscopy at 50  

## 2013-02-10 NOTE — Progress Notes (Signed)
Patient ID: Mary Oconnell, female   DOB: 1966/05/07, 47 y.o.   MRN: [id number] History of Present Illness: Cherre is a 47 year old white female in for a physical. Had left CTS recently.  Current Medications, Allergies, Past Medical History, Past Surgical History, Family History and Social History were reviewed in [employer] record.     Review of Systems: Patient denies any headaches, blurred vision, shortness of breath, chest pain, abdominal pain, problems with bowel movements, urination, or intercourse. Has pain in right knee at times, moods OK trying to decrease ativan,sex hurts at times but partner has curve in penis told her he needs to see urologist.    Physical Exam:BP 100/62  Pulse 72  Ht 5\' 5"  (1.651 m)  Wt 144 lb (65.318 kg)  BMI 23.96 kg/m2 General:  Well developed, well nourished, no acute distress Skin:  Warm and dry Neck:  Midline trachea, normal thyroid Lungs; Clear to auscultation bilaterally Breast:  No dominant palpable mass, retraction, or nipple discharge Cardiovascular: Regular rate and rhythm Abdomen:  Soft, non tender, no hepatosplenomegaly Pelvic:  External genitalia is normal in appearance.  The vagina is normal in appearance. The cervix and uterus are absent.  No  adnexal masses or tenderness noted. Rectal: Good sphincter tone, no polyps, or hemorrhoids felt.  Hemoccult negative. Extremities:  No swelling or varicosities noted Psych: alert and cooperative seems happy   Impression: Yearly exam no pap ET Anxiety    Plan: Physical in 1 year Mammogram yearly  Colonoscopy at 50  Labs in near future Refilled minivelle x 1 year Rx Motrin 800 mg #60 1 every 8 hours prn pain with 1 refill

## 2013-02-27 ENCOUNTER — Other Ambulatory Visit: Payer: Self-pay | Admitting: *Deleted

## 2013-02-28 MED ORDER — METRONIDAZOLE 0.75 % VA GEL: 1.0000 | Freq: Two times a day (BID) | VAGINAL | Status: DC

## 2013-03-18 ENCOUNTER — Other Ambulatory Visit (HOSPITAL_COMMUNITY): Payer: Self-pay | Admitting: Family Medicine

## 2013-03-18 DIAGNOSIS — R109 Unspecified abdominal pain: Secondary | ICD-10-CM

## 2013-03-20 ENCOUNTER — Ambulatory Visit (HOSPITAL_COMMUNITY)
Admission: RE | Admit: 2013-03-20 | Discharge: 2013-03-20 | Disposition: A | Discharge status: Home / Self Care | Payer: BC Managed Care – PPO | Source: Ambulatory Visit | Attending: Family Medicine | Admitting: Family Medicine

## 2013-03-20 ENCOUNTER — Other Ambulatory Visit: Payer: Self-pay

## 2013-03-20 DIAGNOSIS — R112 Nausea with vomiting, unspecified: Secondary | ICD-10-CM | POA: Insufficient documentation

## 2013-03-20 DIAGNOSIS — R109 Unspecified abdominal pain: Secondary | ICD-10-CM | POA: Insufficient documentation

## 2013-03-27 ENCOUNTER — Other Ambulatory Visit (HOSPITAL_COMMUNITY): Payer: Self-pay | Admitting: Family Medicine

## 2013-03-27 DIAGNOSIS — R109 Unspecified abdominal pain: Secondary | ICD-10-CM

## 2013-04-01 ENCOUNTER — Encounter (HOSPITAL_COMMUNITY): Payer: BC Managed Care – PPO

## 2013-04-15 ENCOUNTER — Encounter (HOSPITAL_COMMUNITY): Payer: Self-pay | Admitting: Pharmacy Technician

## 2013-04-15 NOTE — Patient Instructions (Signed)
Mary Oconnell  04/15/2013   Your procedure is scheduled on:  Monday, 04/28/13  Report to [facility] at 0730 AM.  Call this number if you have problems the morning of surgery: [phone]   Remember:   Do not eat food or drink liquids after midnight.   Take these medicines the morning of surgery with A SIP OF WATER: pepcid, xopenex, and ativan   Do not wear jewelry, make-up or nail polish.  Do not wear lotions, powders, or perfumes. You may wear deodorant.  Do not shave 48 hours prior to surgery. Men may shave face and neck.  Do not bring valuables to the hospital.  [facility] is not responsible                  for any belongings or valuables.               Contacts, dentures or bridgework may not be worn into surgery.  Leave suitcase in the car. After surgery it may be brought to your room.  For patients admitted to the hospital, discharge time is determined by your                treatment team.               Patients discharged the day of surgery will not be allowed to drive  home.  Name and phone number of your driver: family  Special Instructions: Shower using CHG 2 nights before surgery and the night before surgery.  If you shower the day of surgery use CHG.  Use special wash - you have one bottle of CHG for all showers.  You should use approximately 1/3 of the bottle for each shower.   Please read over the following fact sheets that you were given: Coughing and Deep Breathing, Anesthesia Post-op Instructions and Care and Recovery After Surgery   Laparoscopic Cholecystectomy Laparoscopic cholecystectomy is surgery to remove the gallbladder. The gallbladder is located slightly to the right of center in the abdomen, behind the liver. It is a concentrating and storage sac for the bile produced in the liver. Bile aids in the digestion and absorption of fats. Gallbladder disease (cholecystitis) is an inflammation of your gallbladder. This condition is usually caused by a buildup of  gallstones (cholelithiasis) in your gallbladder. Gallstones can block the flow of bile, resulting in inflammation and pain. In severe cases, emergency surgery may be required. When emergency surgery is not required, you will have time to prepare for the procedure. Laparoscopic surgery is an alternative to open surgery. Laparoscopic surgery usually has a shorter recovery time. Your common bile duct may also need to be examined and explored. Your caregiver will discuss this with you if he or she feels this should be done. If stones are found in the common bile duct, they may be removed. LET YOUR CAREGIVER KNOW ABOUT:  Allergies to food or medicine.  Medicines taken, including vitamins, herbs, eyedrops, over-the-counter medicines, and creams.  Use of steroids (by mouth or creams).  Previous problems with anesthetics or numbing medicines.  History of bleeding problems or blood clots.  Previous surgery.  Other health problems, including diabetes and kidney problems.  Possibility of pregnancy, if this applies. RISKS AND COMPLICATIONS All surgery is associated with risks. Some problems that may occur following this procedure include:  Infection.  Damage to the common bile duct, nerves, arteries, veins, or other internal organs such as the stomach or intestines.  Bleeding.  A  stone may remain in the common bile duct. BEFORE THE PROCEDURE  Do not take aspirin for 3 days prior to surgery or blood thinners for 1 week prior to surgery.  Do not eat or drink anything after midnight the night before surgery.  Let your caregiver know if you develop a cold or other infectious problem prior to surgery.  You should be present 60 minutes before the procedure or as directed. PROCEDURE  You will be given medicine that makes you sleep (general anesthetic). When you are asleep, your surgeon will make several small cuts (incisions) in your abdomen. One of these incisions is used to insert a small,  lighted scope (laparoscope) into the abdomen. The laparoscope helps the surgeon see into your abdomen. Carbon dioxide gas will be pumped into your abdomen. The gas allows more room for the surgeon to perform your surgery. Other operating instruments are inserted through the other incisions. Laparoscopic procedures may not be appropriate when:  There is major scarring from previous surgery.  The gallbladder is extremely inflamed.  There are bleeding disorders or unexpected cirrhosis of the liver.  A pregnancy is near term.  Other conditions make the laparoscopic procedure impossible. If your surgeon feels it is not safe to continue with a laparoscopic procedure, he or she will perform an open abdominal procedure. In this case, the surgeon will make an incision to open the abdomen. This gives the surgeon a larger view and field to work within. This may allow the surgeon to perform procedures that sometimes cannot be performed with a laparoscope alone. Open surgery has a longer recovery time. AFTER THE PROCEDURE  You will be taken to the recovery area where a nurse will watch and check your progress.  You may be allowed to go home the same day.  Do not resume physical activities until directed by your caregiver.  You may resume a normal diet and activities as directed. Document Released: 05/01/2005 Document Revised: 07/24/2011 Document Reviewed: 12/11/2012 [facility] Patient Information 2014 [place], [place].  Laparoscopic Cholecystectomy Care After These instructions give you information on caring for yourself after your procedure. Your doctor may also give you more specific instructions. Call your doctor if you have any problems or questions after your procedure. HOME CARE  Change your bandages (dressings) as told by your doctor.  Keep the wound dry and clean. Wash the wound gently with soap and water. Pat the wound dry with a clean towel.  Do not take baths, swim, or use hot tubs for 10  days, or as told by your doctor.  Only take medicine as told by your doctor.  Eat a normal diet as told by your doctor.  Do not lift anything heavier than 25 pounds (11.5 kg), or as told by your doctor.  Do not play contact sports for 1 week, or as told by your doctor. GET HELP RIGHT AWAY IF:   Your wound is red, puffy (swollen), or painful.  You have yellowish-white fluid (pus) coming from the wound.  You have fluid draining from the wound for more than 1 day.  You have a bad smell coming from the wound.  Your wound breaks open.  You have a rash.  You have trouble breathing.  You have chest pain.  You have a bad reaction to your medicine.  You have a fever.  You have pain in the shoulders (shoulder strap areas).  You feel dizzy or pass out (faint).  You have severe belly (abdominal) pain.  You feel  sick to your stomach (nauseous) or throw up (vomit) for more than 1 day. MAKE SURE YOU:  Understand these instructions.  Will watch your condition.  Will get help right away if you are not doing well or get worse. Document Released: 02/08/2008 Document Revised: 08/26/2012 Document Reviewed: 12/11/2012 [facility] Patient Information 2014 [place], [place]. PATIENT INSTRUCTIONS POST-ANESTHESIA  IMMEDIATELY FOLLOWING SURGERY:  Do not drive or operate machinery for the first twenty four hours after surgery.  Do not make any important decisions for twenty four hours after surgery or while taking narcotic pain medications or sedatives.  If you develop intractable nausea and vomiting or a severe headache please notify your doctor immediately.  FOLLOW-UP:  Please make an appointment with your surgeon as instructed. You do not need to follow up with anesthesia unless specifically instructed to do so.  WOUND CARE INSTRUCTIONS (if applicable):  Keep a dry clean dressing on the anesthesia/puncture wound site if there is drainage.  Once the wound has quit draining you may leave it  open to air.  Generally you should leave the bandage intact for twenty four hours unless there is drainage.  If the epidural site drains for more than 36-48 hours please call the anesthesia department.  QUESTIONS?:  Please feel free to call your physician or the hospital operator if you have any questions, and they will be happy to assist you.

## 2013-04-15 NOTE — H&P (Signed)
  NTS SOAP Note  Vital Signs:  Vitals as of: 04/15/2013: Systolic 109: Diastolic 73: Heart Rate 66: Temp 98.28F: Height 23ft 5.5in: Weight 143Lbs 0 Ounces: Pain Level 4: BMI 23.43  BMI : 23.43 kg/m2  Subjective: This 64 Years 27 Months old Female presents for of abdominal pain.  Has been having right upper quadrant abdominal pain with radiation to the right flank, nausea, and food intolerance.  No fever, chills, jaundice.  U/S of gallbladder reveals cholelithiasis, normal common bile duct.  Review of Symptoms:  Constitutional:unremarkable   Head:unremarkable    Eyes:unremarkable   Nose/Mouth/Throat:unremarkable Cardiovascular:  unremarkable   Respiratory:  wheezing Gastrointestin    abdominal pain,nausea,vomiting,heartburn Genitourinary:unremarkable     Musculoskeletal:unremarkable   Skin:unremarkable Hematolgic/Lymphatic:unremarkable     Allergic/Immunologic:unremarkable     Past Medical History:    Reviewed   Past Medical History  Surgical History: left hand surgery Medical Problems: asthma Allergies: shellfish, latex Medications: minivelle patch, ativan, xopenex   Social History:Reviewed  Social History  Preferred Language: English Race:  White Ethnicity: Not Hispanic / Latino Age: 47 Years 11 Months Marital Status:  M Alcohol:  No Recreational drug(s):  No   Smoking Status: Never smoker reviewed on 04/15/2013 Functional Status reviewed on mm/dd/yyyy ------------------------------------------------ Bathing: Normal Cooking: Normal Dressing: Normal Driving: Normal Eating: Normal Managing Meds: Normal Oral Care: Normal Shopping: Normal Toileting: Normal Transferring: Normal Walking: Normal Cognitive Status reviewed on mm/dd/yyyy ------------------------------------------------ Attention: Normal Decision Making: Normal Language: Normal Memory: Normal Motor: Normal Perception: Normal Problem Solving:  Normal Visual and Spatial: Normal   Family History:  Reviewed  Family Health History Mother, Living; Healthy; healthy Father, Deceased; Lung cancer;     Objective Information: General:  Well appearing, well nourished in no distress.   no scleral icterus Heart:  RRR, no murmur Lungs:    CTA bilaterally, no wheezes, rhonchi, rales.  Breathing unlabored. Abdomen:Soft, tender in the right upper quadrant to palpation, ND, no HSM, no masses.  Assessment:Biliary colic, cholelithiasis  Diagnosis &amp; Procedure Smart Code   Plan:Scheduled for laparoscopic cholecystectomy on 04/18/13.   Patient Education:Alternative treatments to surgery were discussed with patient (and family).  Risks and benefits  of procedure including bleeding, infection, hepatobiliary injury, and the possibility of an open procedure were fully explained to the patient (and family) who gave informed consent. Patient/family questions were addressed.  Follow-up:Pending Surgery

## 2013-04-16 ENCOUNTER — Encounter (HOSPITAL_COMMUNITY)
Admission: RE | Admit: 2013-04-16 | Discharge: 2013-04-16 | Disposition: A | Discharge status: Home / Self Care | Payer: BC Managed Care – PPO | Source: Ambulatory Visit | Attending: General Surgery | Admitting: General Surgery

## 2013-04-16 ENCOUNTER — Encounter (HOSPITAL_COMMUNITY): Payer: Self-pay

## 2013-04-16 LAB — HEPATIC FUNCTION PANEL
AST: 13 U/L (ref 0–37)
Albumin: 3.8 g/dL (ref 3.5–5.2)
Total Bilirubin: 0.7 mg/dL (ref 0.3–1.2)

## 2013-04-16 LAB — CBC WITH DIFFERENTIAL/PLATELET
Basophils Relative: 0 % (ref 0–1)
Eosinophils Absolute: 0.1 10*3/uL (ref 0.0–0.7)
Eosinophils Relative: 2 % (ref 0–5)
HCT: 42.3 % (ref 36.0–46.0)
Hemoglobin: 14.7 g/dL (ref 12.0–15.0)
MCH: 31.7 pg (ref 26.0–34.0)
MCHC: 34.8 g/dL (ref 30.0–36.0)
MCV: 91.2 fL (ref 78.0–100.0)
Monocytes Absolute: 0.7 10*3/uL (ref 0.1–1.0)
Monocytes Relative: 12 % (ref 3–12)
Neutrophils Relative %: 44 % (ref 43–77)

## 2013-04-16 LAB — BASIC METABOLIC PANEL
BUN: 10 mg/dL (ref 6–23)
GFR calc non Af Amer: 67 mL/min — ABNORMAL LOW (ref >90)
Glucose, Bld: 97 mg/dL (ref 70–99)
Potassium: 4.1 mEq/L (ref 3.5–5.1)

## 2013-04-18 ENCOUNTER — Encounter (HOSPITAL_COMMUNITY): Payer: Self-pay | Admitting: *Deleted

## 2013-04-18 ENCOUNTER — Ambulatory Visit (HOSPITAL_COMMUNITY): Payer: BC Managed Care – PPO | Admitting: Anesthesiology

## 2013-04-18 ENCOUNTER — Encounter (HOSPITAL_COMMUNITY): Payer: BC Managed Care – PPO | Admitting: Anesthesiology

## 2013-04-18 ENCOUNTER — Encounter (HOSPITAL_COMMUNITY)
Admission: RE | Disposition: A | Discharge status: Home / Self Care | Payer: Self-pay | Source: Ambulatory Visit | Attending: General Surgery

## 2013-04-18 ENCOUNTER — Ambulatory Visit (HOSPITAL_COMMUNITY)
Admission: RE | Admit: 2013-04-18 | Discharge: 2013-04-18 | Disposition: A | Discharge status: Home / Self Care | Payer: BC Managed Care – PPO | Source: Ambulatory Visit | Attending: General Surgery | Admitting: General Surgery

## 2013-04-18 DIAGNOSIS — K8020 Calculus of gallbladder without cholecystitis without obstruction: Secondary | ICD-10-CM | POA: Insufficient documentation

## 2013-04-18 HISTORY — PX: CHOLECYSTECTOMY: SHX55

## 2013-04-18 SURGERY — LAPAROSCOPIC CHOLECYSTECTOMY
Anesthesia: General | Site: Abdomen

## 2013-04-18 MED ORDER — SCOPOLAMINE 1 MG/3DAYS TD PT72
1.0000 | MEDICATED_PATCH | Freq: Once | TRANSDERMAL | Status: DC
  Administered 2013-04-18: 1.5 mg via TRANSDERMAL
  Filled 2013-04-18: qty 1

## 2013-04-18 MED ORDER — HYDROCODONE-ACETAMINOPHEN 5-325 MG PO TABS: 1.0000 | ORAL_TABLET | ORAL | Status: DC | PRN

## 2013-04-18 MED ORDER — HEMOSTATIC AGENTS (NO CHARGE) OPTIME
TOPICAL | Status: DC | PRN
  Administered 2013-04-18: 1 via TOPICAL

## 2013-04-18 MED ORDER — GLYCOPYRROLATE 0.2 MG/ML IJ SOLN
INTRAMUSCULAR | Status: DC | PRN
  Administered 2013-04-18: .2 mg via INTRAVENOUS
  Administered 2013-04-18: .6 mg via INTRAVENOUS

## 2013-04-18 MED ORDER — SUCCINYLCHOLINE CHLORIDE 20 MG/ML IJ SOLN
INTRAMUSCULAR | Status: AC
  Filled 2013-04-18: qty 1

## 2013-04-18 MED ORDER — NEOSTIGMINE METHYLSULFATE 1 MG/ML IJ SOLN
INTRAMUSCULAR | Status: DC | PRN
  Administered 2013-04-18: 1 mg via INTRAVENOUS
  Administered 2013-04-18: 3 mg via INTRAVENOUS

## 2013-04-18 MED ORDER — ONDANSETRON HCL 4 MG/2ML IJ SOLN: 4.0000 mg | Freq: Once | INTRAMUSCULAR | Status: DC | PRN

## 2013-04-18 MED ORDER — FENTANYL CITRATE 0.05 MG/ML IJ SOLN
INTRAMUSCULAR | Status: DC | PRN
  Administered 2013-04-18 (×3): 50 ug via INTRAVENOUS

## 2013-04-18 MED ORDER — KETOROLAC TROMETHAMINE 30 MG/ML IJ SOLN
30.0000 mg | Freq: Once | INTRAMUSCULAR | Status: AC
  Administered 2013-04-18: 30 mg via INTRAVENOUS
  Filled 2013-04-18: qty 1

## 2013-04-18 MED ORDER — ONDANSETRON HCL 4 MG/2ML IJ SOLN
4.0000 mg | Freq: Once | INTRAMUSCULAR | Status: AC
  Administered 2013-04-18: 4 mg via INTRAVENOUS
  Filled 2013-04-18: qty 2

## 2013-04-18 MED ORDER — CLINDAMYCIN PHOSPHATE 600 MG/50ML IV SOLN
600.0000 mg | Freq: Once | INTRAVENOUS | Status: AC
  Administered 2013-04-18: 600 mg via INTRAVENOUS
  Filled 2013-04-18: qty 50

## 2013-04-18 MED ORDER — LACTATED RINGERS IV SOLN
INTRAVENOUS | Status: DC
  Administered 2013-04-18: 07:00:00 via INTRAVENOUS

## 2013-04-18 MED ORDER — MIDAZOLAM HCL 2 MG/2ML IJ SOLN
1.0000 mg | INTRAMUSCULAR | Status: DC | PRN
  Administered 2013-04-18 (×2): 2 mg via INTRAVENOUS
  Filled 2013-04-18 (×2): qty 2

## 2013-04-18 MED ORDER — BUPIVACAINE HCL (PF) 0.5 % IJ SOLN
INTRAMUSCULAR | Status: AC
  Filled 2013-04-18: qty 30

## 2013-04-18 MED ORDER — SUCCINYLCHOLINE CHLORIDE 20 MG/ML IJ SOLN
INTRAMUSCULAR | Status: DC | PRN
  Administered 2013-04-18: 100 mg via INTRAVENOUS

## 2013-04-18 MED ORDER — GLYCOPYRROLATE 0.2 MG/ML IJ SOLN
INTRAMUSCULAR | Status: AC
  Filled 2013-04-18: qty 3

## 2013-04-18 MED ORDER — SODIUM CHLORIDE 0.9 % IR SOLN
Status: DC | PRN
Start: 2013-04-18 — End: 2013-04-18
  Administered 2013-04-18: 500 mL

## 2013-04-18 MED ORDER — LIDOCAINE HCL 1 % IJ SOLN
INTRAMUSCULAR | Status: DC | PRN
  Administered 2013-04-18: 30 mg via INTRADERMAL

## 2013-04-18 MED ORDER — FENTANYL CITRATE 0.05 MG/ML IJ SOLN
INTRAMUSCULAR | Status: AC
  Filled 2013-04-18: qty 5

## 2013-04-18 MED ORDER — BACITRACIN ZINC 500 UNIT/GM EX OINT
TOPICAL_OINTMENT | CUTANEOUS | Status: DC | PRN
  Administered 2013-04-18: 1 via TOPICAL

## 2013-04-18 MED ORDER — FENTANYL CITRATE 0.05 MG/ML IJ SOLN
INTRAMUSCULAR | Status: AC
  Filled 2013-04-18: qty 2

## 2013-04-18 MED ORDER — ROCURONIUM BROMIDE 50 MG/5ML IV SOLN
INTRAVENOUS | Status: AC
  Filled 2013-04-18: qty 1

## 2013-04-18 MED ORDER — BACITRACIN ZINC 500 UNIT/GM EX OINT
TOPICAL_OINTMENT | CUTANEOUS | Status: AC
  Filled 2013-04-18: qty 0.9

## 2013-04-18 MED ORDER — BUPIVACAINE HCL (PF) 0.5 % IJ SOLN
INTRAMUSCULAR | Status: DC | PRN
  Administered 2013-04-18: 10 mL

## 2013-04-18 MED ORDER — ROCURONIUM BROMIDE 100 MG/10ML IV SOLN
INTRAVENOUS | Status: DC | PRN
  Administered 2013-04-18: 5 mg via INTRAVENOUS
  Administered 2013-04-18: 25 mg via INTRAVENOUS

## 2013-04-18 MED ORDER — ROCURONIUM BROMIDE 100 MG/10ML IV SOLN: INTRAVENOUS | Status: DC | PRN

## 2013-04-18 MED ORDER — DEXAMETHASONE SODIUM PHOSPHATE 4 MG/ML IJ SOLN
4.0000 mg | Freq: Once | INTRAMUSCULAR | Status: AC
  Administered 2013-04-18: 4 mg via INTRAVENOUS
  Filled 2013-04-18: qty 1

## 2013-04-18 MED ORDER — LIDOCAINE HCL (PF) 1 % IJ SOLN
INTRAMUSCULAR | Status: AC
  Filled 2013-04-18: qty 5

## 2013-04-18 MED ORDER — NEOSTIGMINE METHYLSULFATE 1 MG/ML IJ SOLN
INTRAMUSCULAR | Status: AC
  Filled 2013-04-18: qty 1

## 2013-04-18 MED ORDER — PROPOFOL 10 MG/ML IV EMUL
INTRAVENOUS | Status: AC
  Filled 2013-04-18: qty 20

## 2013-04-18 MED ORDER — FENTANYL CITRATE 0.05 MG/ML IJ SOLN
25.0000 ug | INTRAMUSCULAR | Status: DC | PRN
  Administered 2013-04-18 (×2): 50 ug via INTRAVENOUS

## 2013-04-18 MED ORDER — PROPOFOL 10 MG/ML IV BOLUS
INTRAVENOUS | Status: DC | PRN
  Administered 2013-04-18: 150 mg via INTRAVENOUS

## 2013-04-18 SURGICAL SUPPLY — 43 items
APPLIER CLIP LAPSCP 10X32 DD (CLIP) ×2 IMPLANT
BAG HAMPER (MISCELLANEOUS) ×2 IMPLANT
BAG SPEC RTRVL LRG 6X4 10 (ENDOMECHANICALS) ×1
CHLORAPREP W/TINT 26ML (MISCELLANEOUS) ×1 IMPLANT
CLOTH BEACON ORANGE TIMEOUT ST (SAFETY) ×2 IMPLANT
COVER LIGHT HANDLE STERIS (MISCELLANEOUS) ×4 IMPLANT
DECANTER SPIKE VIAL GLASS SM (MISCELLANEOUS) ×2 IMPLANT
ELECT REM PT RETURN 9FT ADLT (ELECTROSURGICAL) ×2
ELECTRODE REM PT RTRN 9FT ADLT (ELECTROSURGICAL) ×1 IMPLANT
FILTER SMOKE EVAC LAPAROSHD (FILTER) ×2 IMPLANT
FORMALIN 10 PREFIL 120ML (MISCELLANEOUS) ×2 IMPLANT
GLOVE BIO SURGEON STRL SZ7.5 (GLOVE) ×2 IMPLANT
GLOVE BIOGEL PI IND STRL 8 (GLOVE) ×1 IMPLANT
GLOVE BIOGEL PI INDICATOR 8 (GLOVE) ×1
GLOVE SKINSENSE NS SZ7.0 (GLOVE) ×1
GLOVE SKINSENSE NS SZ7.5 (GLOVE) ×1
GLOVE SKINSENSE STRL SZ7.0 (GLOVE) IMPLANT
GLOVE SKINSENSE STRL SZ7.5 (GLOVE) IMPLANT
GOWN STRL REIN XL XLG (GOWN DISPOSABLE) ×6 IMPLANT
HEMOSTAT SNOW SURGICEL 2X4 (HEMOSTASIS) ×2 IMPLANT
INST SET LAPROSCOPIC AP (KITS) ×2 IMPLANT
IV NS IRRIG 3000ML ARTHROMATIC (IV SOLUTION) IMPLANT
KIT ROOM TURNOVER APOR (KITS) ×2 IMPLANT
MANIFOLD NEPTUNE II (INSTRUMENTS) ×2 IMPLANT
NDL INSUFFLATION 14GA 120MM (NEEDLE) ×1 IMPLANT
NEEDLE INSUFFLATION 14GA 120MM (NEEDLE) ×2 IMPLANT
NS IRRIG 1000ML POUR BTL (IV SOLUTION) ×2 IMPLANT
PACK LAP CHOLE LZT030E (CUSTOM PROCEDURE TRAY) ×2 IMPLANT
PAD ARMBOARD 7.5X6 YLW CONV (MISCELLANEOUS) ×2 IMPLANT
POUCH SPECIMEN RETRIEVAL 10MM (ENDOMECHANICALS) ×2 IMPLANT
SET BASIN LINEN APH (SET/KITS/TRAYS/PACK) ×2 IMPLANT
SET TUBE IRRIG SUCTION NO TIP (IRRIGATION / IRRIGATOR) IMPLANT
SLEEVE ENDOPATH XCEL 5M (ENDOMECHANICALS) ×2 IMPLANT
SPONGE GAUZE 2X2 8PLY STRL LF (GAUZE/BANDAGES/DRESSINGS) ×8 IMPLANT
STAPLER VISISTAT (STAPLE) ×2 IMPLANT
SUT VICRYL 0 UR6 27IN ABS (SUTURE) ×2 IMPLANT
TAPE CLOTH SURG 4X10 WHT LF (GAUZE/BANDAGES/DRESSINGS) ×1 IMPLANT
TROCAR ENDO BLADELESS 11MM (ENDOMECHANICALS) ×2 IMPLANT
TROCAR XCEL NON-BLD 5MMX100MML (ENDOMECHANICALS) ×2 IMPLANT
TROCAR XCEL UNIV SLVE 11M 100M (ENDOMECHANICALS) ×2 IMPLANT
TUBING INSUFFLATION (TUBING) ×2 IMPLANT
WARMER LAPAROSCOPE (MISCELLANEOUS) ×2 IMPLANT
YANKAUER SUCT 12FT TUBE ARGYLE (SUCTIONS) ×2 IMPLANT

## 2013-04-18 NOTE — Transfer of Care (Signed)
Immediate Anesthesia Transfer of Care Note  Patient: Mary Oconnell  Procedure(s) Performed: Procedure(s): LAPAROSCOPIC CHOLECYSTECTOMY (N/A)  Patient Location: PACU  Anesthesia Type:General  Level of Consciousness: awake, alert  and oriented  Airway & Oxygen Therapy: Patient Spontanous Breathing and Patient connected to face mask oxygen  Post-op Assessment: Report given to PACU RN  Post vital signs: Reviewed and stable  Complications: No apparent anesthesia complications

## 2013-04-18 NOTE — Interval H&P Note (Signed)
History and Physical Interval Note:  04/18/2013 7:18 AM  Newman Nip  has presented today for surgery, with the diagnosis of cholelithiasis  The various methods of treatment have been discussed with the patient and family. After consideration of risks, benefits and other options for treatment, the patient has consented to  Procedure(s): LAPAROSCOPIC CHOLECYSTECTOMY (N/A) as a surgical intervention .  The patient's history has been reviewed, patient examined, no change in status, stable for surgery.  I have reviewed the patient's chart and labs.  Questions were answered to the patient's satisfaction.     Franky Macho A

## 2013-04-18 NOTE — Anesthesia Preprocedure Evaluation (Signed)
Anesthesia Evaluation  Patient identified by MRN, date of birth, ID band Patient awake    Reviewed: Allergy & Precautions, H&P , NPO status , Patient's Chart, lab work & pertinent test results  Airway Mallampati: II TM Distance: >3 FB Neck ROM: Full    Dental  (+) Edentulous Upper   Pulmonary asthma , former smoker,  breath sounds clear to auscultation        Cardiovascular negative cardio ROS  Rhythm:Regular Rate:Normal     Neuro/Psych PSYCHIATRIC DISORDERS Anxiety    GI/Hepatic GERD- (nauseated today)  Controlled,  Endo/Other    Renal/GU      Musculoskeletal  (+) Fibromyalgia -  Abdominal   Peds  Hematology   Anesthesia Other Findings   Reproductive/Obstetrics                           Anesthesia Physical Anesthesia Plan  ASA: II  Anesthesia Plan: General   Post-op Pain Management:    Induction: Intravenous, Rapid sequence and Cricoid pressure planned  Airway Management Planned: Oral ETT  Additional Equipment:   Intra-op Plan:   Post-operative Plan: Extubation in OR  Informed Consent: I have reviewed the patients History and Physical, chart, labs and discussed the procedure including the risks, benefits and alternatives for the proposed anesthesia with the patient or authorized representative who has indicated his/her understanding and acceptance.     Plan Discussed with:   Anesthesia Plan Comments:         Anesthesia Quick Evaluation

## 2013-04-18 NOTE — Anesthesia Postprocedure Evaluation (Signed)
  Anesthesia Post-op Note  Patient: Mary Oconnell  Procedure(s) Performed: Procedure(s): LAPAROSCOPIC CHOLECYSTECTOMY (N/A)  Patient Location: PACU  Anesthesia Type:General  Level of Consciousness: awake, alert  and oriented  Airway and Oxygen Therapy: Patient Spontanous Breathing and Patient connected to face mask oxygen  Post-op Pain: mild  Post-op Assessment: Post-op Vital signs reviewed, Patient's Cardiovascular Status Stable, Respiratory Function Stable, Patent Airway and No signs of Nausea or vomiting  Post-op Vital Signs: Reviewed and stable  Complications: No apparent anesthesia complications

## 2013-04-18 NOTE — Addendum Note (Signed)
Addendum created 04/18/13 1610 by Moshe Salisbury, CRNA   Modules edited: Anesthesia Medication Administration

## 2013-04-18 NOTE — Op Note (Signed)
Patient:  Mary Oconnell  DOB:  1965-10-01  MRN:  [id number]   Preop Diagnosis:  Cholecystitis, cholelithiasis  Postop Diagnosis:  Same  Procedure:  Laparoscopic cholecystectomy  Surgeon:  Franky Macho, M.D.  Anes:  General endotracheal  Indications:  Patient is a 23 or old white female who presents with biliary colic secondary to cholelithiasis. The risks and benefits of the procedure including bleeding, infection, hepatobiliary, and the possibility of an open procedure were fully explained to the patient, who gave informed consent.  Procedure note:  The patient is placed the supine position. After induction of general endotracheal anesthesia, the abdomen was prepped and draped using usual sterile technique with chlor prep. Surgical site confirmation was performed.  An infraumbilical incision was made down to the fascia. A Veress needle was introduced into the abdominal cavity and confirmation of placement was done using the saline drop test. The abdomen was then insufflated to 16 mm mercury pressure. An 11 mm trocar was introduced into the abdominal cavity under direct visualization without difficulty. The patient was placed in reverse Trendelenburg position and additional 11 mm trocar was placed the epigastric region and 5 mm trochars were placed the right upper quadrant and right flank regions. The liver was inspected and noted within normal limits. The gallbladder was retracted in a dynamic fashion in order to expose the triangle of Calot. The cystic duct was first identified. Its juncture to the infundibulum was fully identified. Endoclips placed proximally and distally on the cystic duct, and the cystic duct was divided. This was likewise done cystic artery. The gallbladder was then freed away from the gallbladder fossa using Bovie electrocautery. The gallbladder was delivered through the epigastric trocar site using an Endo Catch bag. The gallbladder fossa was inspected and no abnormal  bleeding or bile leakage was noted. Surgicel is placed the gallbladder fossa. All fluid and air were then evacuated from the abdominal cavity prior to removal of the trochars.  All wounds were irrigated with normal saline. All wounds were injected with 0.5% Sensorcaine. The infraumbilical fascia as well as epigastric fascia were reapproximated using 0 Vicryl interrupted sutures. All skin incisions were closed using staples. Bacitracin ointment and dry sterile dressings were applied.  All tape and needle counts were correct at the end of the procedure. Patient was extubated in the operating room and transferred to PACU in stable condition.  Complications:  None  EBL:  Minimal  Specimen:  Gallbladder

## 2013-04-18 NOTE — Anesthesia Procedure Notes (Signed)
Procedure Name: Intubation Date/Time: 04/18/2013 7:42 AM Performed by: Glynn Octave E Pre-anesthesia Checklist: Patient identified, Patient being monitored, Timeout performed, Emergency Drugs available and Suction available Patient Re-evaluated:Patient Re-evaluated prior to inductionOxygen Delivery Method: Circle System Utilized Preoxygenation: Pre-oxygenation with 100% oxygen Intubation Type: IV induction, Rapid sequence and Cricoid Pressure applied Ventilation: Mask ventilation without difficulty Laryngoscope Size: Mac and 3 Grade View: Grade I Tube type: Oral Tube size: 7.0 mm Number of attempts: 1 Airway Equipment and Method: stylet Placement Confirmation: ETT inserted through vocal cords under direct vision,  positive ETCO2 and breath sounds checked- equal and bilateral Secured at: 21 cm Tube secured with: Tape Dental Injury: Teeth and Oropharynx as per pre-operative assessment

## 2013-04-21 ENCOUNTER — Encounter (HOSPITAL_COMMUNITY): Payer: Self-pay | Admitting: General Surgery

## 2013-05-27 ENCOUNTER — Encounter: Payer: Self-pay | Admitting: Adult Health

## 2013-05-27 ENCOUNTER — Ambulatory Visit (INDEPENDENT_AMBULATORY_CARE_PROVIDER_SITE_OTHER): Payer: BC Managed Care – PPO | Admitting: Adult Health

## 2013-05-27 VITALS — BP 98/56 | Ht 65.0 in | Wt 145.0 lb

## 2013-05-27 DIAGNOSIS — R350 Frequency of micturition: Secondary | ICD-10-CM

## 2013-05-27 DIAGNOSIS — B379 Candidiasis, unspecified: Secondary | ICD-10-CM

## 2013-05-27 DIAGNOSIS — K5900 Constipation, unspecified: Secondary | ICD-10-CM

## 2013-05-27 HISTORY — DX: Candidiasis, unspecified: B37.9

## 2013-05-27 HISTORY — DX: Constipation, unspecified: K59.00

## 2013-05-27 LAB — POCT WET PREP (WET MOUNT): WBC WET PREP: NEGATIVE

## 2013-05-27 LAB — POCT URINALYSIS DIPSTICK
Blood, UA: NEGATIVE
GLUCOSE UA: NEGATIVE
KETONES UA: NEGATIVE
Leukocytes, UA: NEGATIVE
Nitrite, UA: NEGATIVE
Protein, UA: NEGATIVE

## 2013-05-27 MED ORDER — FLUCONAZOLE 150 MG PO TABS: ORAL_TABLET | ORAL | Status: DC

## 2013-05-27 MED ORDER — LUBIPROSTONE 24 MCG PO CAPS: 24.0000 ug | ORAL_CAPSULE | Freq: Two times a day (BID) | ORAL | Status: DC

## 2013-05-27 NOTE — Progress Notes (Signed)
Subjective:     Patient ID: Mary Oconnell, female   DOB: 1965/09/29, 48 y.o.   MRN: [id number]  HPI Graziella is a 48 year old white female in complaining of urinary frequency, burning, pressure and ? Bump on labia.Has used AZO.also complains of constipation, had GB removed and has some pain right side.  Review of Systems See HPI Reviewed past medical,surgical, social and family history. Reviewed medications and allergies.     Objective:   Physical Exam BP 98/56  Ht 5\' 5"  (1.651 m)  Wt 145 lb (65.772 kg)  BMI 24.13 kg/m2urine dipstick negative, Skin warm and dry.Pelvic: external genitalia is normal in appearance, vagina:red, white discharge without odor, cervix and uterus absent, adnexa: no masses or tenderness noted. Wet prep: + yeast buds   Has some tenderness right side ?muscle Assessment:     Yeast Constipation Urinary frequency    Plan:     Rx diflucan 150 mg #2 1 now and 1 in 3 days with 1 refill Try amitiza 24 mcg #16 1 bid Number of samples 16 tabs Lot number [id number]    Exp date 7/17 Push fluids Review handout on yeast and constipation,call if wants rx  For amitiza Follow up prn

## 2013-05-27 NOTE — Patient Instructions (Signed)
Monilial Vaginitis Vaginitis in a soreness, swelling and redness (inflammation) of the vagina and vulva. Monilial vaginitis is not a sexually transmitted infection. CAUSES  Yeast vaginitis is caused by yeast (candida) that is normally found in your vagina. With a yeast infection, the candida has overgrown in number to a point that upsets the chemical balance. SYMPTOMS   White, thick vaginal discharge.  Swelling, itching, redness and irritation of the vagina and possibly the lips of the vagina (vulva).  Burning or painful urination.  Painful intercourse. DIAGNOSIS  Things that may contribute to monilial vaginitis are:  Postmenopausal and virginal states.  Pregnancy.  Infections.  Being tired, sick or stressed, especially if you had monilial vaginitis in the past.  Diabetes. Good control will help lower the chance.  Birth control pills.  Tight fitting garments.  Using bubble bath, feminine sprays, douches or deodorant tampons.  Taking certain medications that kill germs (antibiotics).  Sporadic recurrence can occur if you become ill. TREATMENT  Your caregiver will give you medication.  There are several kinds of anti monilial vaginal creams and suppositories specific for monilial vaginitis. For recurrent yeast infections, use a suppository or cream in the vagina 2 times a week, or as directed.  Anti-monilial or steroid cream for the itching or irritation of the vulva may also be used. Get your caregiver's permission.  Painting the vagina with methylene blue solution may help if the monilial cream does not work.  Eating yogurt may help prevent monilial vaginitis. HOME CARE INSTRUCTIONS   Finish all medication as prescribed.  Do not have sex until treatment is completed or after your caregiver tells you it is okay.  Take warm sitz baths.  Do not douche.  Do not use tampons, especially scented ones.  Wear cotton underwear.  Avoid tight pants and panty  hose.  Tell your sexual partner that you have a yeast infection. They should go to their caregiver if they have symptoms such as mild rash or itching.  Your sexual partner should be treated as well if your infection is difficult to eliminate.  Practice safer sex. Use condoms.  Some vaginal medications cause latex condoms to fail. Vaginal medications that harm condoms are:  Cleocin cream.  Butoconazole (Femstat).  Terconazole (Terazol) vaginal suppository.  Miconazole (Monistat) (may be purchased over the counter). SEEK MEDICAL CARE IF:   You have a temperature by mouth above 102 F (38.9 C).  The infection is getting worse after 2 days of treatment.  The infection is not getting better after 3 days of treatment.  You develop blisters in or around your vagina.  You develop vaginal bleeding, and it is not your menstrual period.  You have pain when you urinate.  You develop intestinal problems.  You have pain with sexual intercourse. Document Released: 02/08/2005 Document Revised: 07/24/2011 Document Reviewed: 10/23/2008 [facility] Patient Information 2014 [place], [place]. Constipation, Adult Constipation is when a person has fewer than 3 bowel movements a week; has difficulty having a bowel movement; or has stools that are dry, hard, or larger than normal. As people grow older, constipation is more common. If you try to fix constipation with medicines that make you have a bowel movement (laxatives), the problem may get worse. Long-term laxative use may cause the muscles of the colon to become weak. A low-fiber diet, not taking in enough fluids, and taking certain medicines may make constipation worse. CAUSES   Certain medicines, such as antidepressants, pain medicine, iron supplements, antacids, and water pills.  Certain diseases, such as diabetes, irritable bowel syndrome (IBS), thyroid disease, or depression.   Not drinking enough water.   Not eating enough  fiber-rich foods.   Stress or travel.  Lack of physical activity or exercise.  Not going to the restroom when there is the urge to have a bowel movement.  Ignoring the urge to have a bowel movement.  Using laxatives too much. SYMPTOMS   Having fewer than 3 bowel movements a week.   Straining to have a bowel movement.   Having hard, dry, or larger than normal stools.   Feeling full or bloated.   Pain in the lower abdomen.  Not feeling relief after having a bowel movement. DIAGNOSIS  Your caregiver will take a medical history and perform a physical exam. Further testing may be done for severe constipation. Some tests may include:   A barium enema X-ray to examine your rectum, colon, and sometimes, your small intestine.  A sigmoidoscopy to examine your lower colon.  A colonoscopy to examine your entire colon. TREATMENT  Treatment will depend on the severity of your constipation and what is causing it. Some dietary treatments include drinking more fluids and eating more fiber-rich foods. Lifestyle treatments may include regular exercise. If these diet and lifestyle recommendations do not help, your caregiver may recommend taking over-the-counter laxative medicines to help you have bowel movements. Prescription medicines may be prescribed if over-the-counter medicines do not work.  HOME CARE INSTRUCTIONS   Increase dietary fiber in your diet, such as fruits, vegetables, whole grains, and beans. Limit high-fat and processed sugars in your diet, such as [place] fries, hamburgers, cookies, candies, and soda.   A fiber supplement may be added to your diet if you cannot get enough fiber from foods.   Drink enough fluids to keep your urine clear or pale yellow.   Exercise regularly or as directed by your caregiver.   Go to the restroom when you have the urge to go. Do not hold it.  Only take medicines as directed by your caregiver. Do not take other medicines for  constipation without talking to your caregiver first. [place] IF:   You have bright red blood in your stool.   Your constipation lasts for more than 4 days or gets worse.   You have abdominal or rectal pain.   You have thin, pencil-like stools.  You have unexplained weight loss. MAKE SURE YOU:   Understand these instructions.  Will watch your condition.  Will get help right away if you are not doing well or get worse. Document Released: 01/28/2004 Document Revised: 07/24/2011 Document Reviewed: 02/10/2013 [facility] Patient Information 2014 [place], [place]. Push fluids Call prn

## 2013-06-02 ENCOUNTER — Telehealth: Payer: Self-pay | Admitting: Adult Health

## 2013-06-02 MED ORDER — NITROFURANTOIN MONOHYD MACRO 100 MG PO CAPS: 100.0000 mg | ORAL_CAPSULE | Freq: Two times a day (BID) | ORAL | Status: DC

## 2013-06-02 NOTE — Telephone Encounter (Signed)
?  UTI will rx septra ds  

## 2013-06-09 ENCOUNTER — Telehealth: Payer: Self-pay | Admitting: Adult Health

## 2013-06-09 MED ORDER — FLUCONAZOLE 150 MG PO TABS: ORAL_TABLET | ORAL | Status: DC

## 2013-06-09 NOTE — Telephone Encounter (Signed)
Requesting med for yeast infection.

## 2013-06-09 NOTE — Telephone Encounter (Signed)
Refill diflucan 

## 2013-06-17 ENCOUNTER — Encounter: Payer: Self-pay | Admitting: Adult Health

## 2013-06-17 ENCOUNTER — Telehealth: Payer: Self-pay | Admitting: Adult Health

## 2013-06-17 ENCOUNTER — Ambulatory Visit (INDEPENDENT_AMBULATORY_CARE_PROVIDER_SITE_OTHER): Payer: BC Managed Care – PPO | Admitting: Adult Health

## 2013-06-17 VITALS — BP 112/60 | Ht 65.0 in | Wt 147.0 lb

## 2013-06-17 DIAGNOSIS — R350 Frequency of micturition: Secondary | ICD-10-CM

## 2013-06-17 DIAGNOSIS — Z139 Encounter for screening, unspecified: Secondary | ICD-10-CM

## 2013-06-17 DIAGNOSIS — R1031 Right lower quadrant pain: Secondary | ICD-10-CM

## 2013-06-17 DIAGNOSIS — M19049 Primary osteoarthritis, unspecified hand: Secondary | ICD-10-CM

## 2013-06-17 HISTORY — DX: Frequency of micturition: R35.0

## 2013-06-17 HISTORY — DX: Right lower quadrant pain: R10.31

## 2013-06-17 LAB — LIPID PANEL
Cholesterol: 140 mg/dL (ref 0–200)
HDL: 55 mg/dL (ref >39)
LDL CALC: 62 mg/dL (ref 0–99)
Total CHOL/HDL Ratio: 2.5 Ratio
Triglycerides: 113 mg/dL (ref <150)
VLDL: 23 mg/dL (ref 0–40)

## 2013-06-17 LAB — CBC
HCT: 42.6 % (ref 36.0–46.0)
HEMOGLOBIN: 14.4 g/dL (ref 12.0–15.0)
MCH: 31 pg (ref 26.0–34.0)
MCHC: 33.8 g/dL (ref 30.0–36.0)
MCV: 91.8 fL (ref 78.0–100.0)
Platelets: 277 10*3/uL (ref 150–400)
RBC: 4.64 MIL/uL (ref 3.87–5.11)
RDW: 13.4 % (ref 11.5–15.5)
WBC: 6.2 10*3/uL (ref 4.0–10.5)

## 2013-06-17 LAB — SEDIMENTATION RATE: SED RATE: 1 mm/h (ref 0–22)

## 2013-06-17 LAB — POCT URINALYSIS DIPSTICK
Blood, UA: NEGATIVE
Glucose, UA: NEGATIVE
KETONES UA: NEGATIVE
LEUKOCYTES UA: NEGATIVE
NITRITE UA: NEGATIVE
Protein, UA: NEGATIVE

## 2013-06-17 LAB — COMPREHENSIVE METABOLIC PANEL
ALBUMIN: 3.7 g/dL (ref 3.5–5.2)
ALT: 10 U/L (ref 0–35)
AST: 12 U/L (ref 0–37)
Alkaline Phosphatase: 61 U/L (ref 39–117)
BUN: 16 mg/dL (ref 6–23)
CALCIUM: 9.8 mg/dL (ref 8.4–10.5)
CHLORIDE: 102 meq/L (ref 96–112)
CO2: 30 mEq/L (ref 19–32)
Creat: 0.93 mg/dL (ref 0.50–1.10)
Glucose, Bld: 84 mg/dL (ref 70–99)
POTASSIUM: 4.6 meq/L (ref 3.5–5.3)
SODIUM: 137 meq/L (ref 135–145)
TOTAL PROTEIN: 6.3 g/dL (ref 6.0–8.3)
Total Bilirubin: 0.4 mg/dL (ref 0.2–1.2)

## 2013-06-17 MED ORDER — URIBEL 118 MG PO CAPS: ORAL_CAPSULE | ORAL | Status: DC

## 2013-06-17 MED ORDER — PHENAZOPYRIDINE HCL 200 MG PO TABS: 200.0000 mg | ORAL_TABLET | Freq: Three times a day (TID) | ORAL | Status: DC

## 2013-06-17 MED ORDER — IBUPROFEN 800 MG PO TABS: 800.0000 mg | ORAL_TABLET | Freq: Three times a day (TID) | ORAL | Status: DC | PRN

## 2013-06-17 NOTE — Telephone Encounter (Signed)
Pharmacy called uribel cost too much will change to pyridium

## 2013-06-17 NOTE — Patient Instructions (Signed)
Osteoarthritis Osteoarthritis is a disease that causes soreness and swelling (inflammation) of a joint. It occurs when the cartilage at the affected joint wears down. Cartilage acts as a cushion, covering the ends of bones where they meet to form a joint. Osteoarthritis is the most common form of arthritis. It often occurs in older people. The joints affected most often by this condition include those in the: Ends of the fingers. Thumbs. Neck. Lower back. Knees. Hips. CAUSES  Over time, the cartilage that covers the ends of bones begins to wear away. This causes bone to rub on bone, producing pain and stiffness in the affected joints.  RISK FACTORS Certain factors can increase your chances of having osteoarthritis, including: Older age. Excessive body weight. Overuse of joints. SIGNS AND SYMPTOMS  Pain, swelling, and stiffness in the joint. Over time, the joint may lose its normal shape. Small deposits of bone (osteophytes) may grow on the edges of the joint. Bits of bone or cartilage can break off and float inside the joint space. This may cause more pain and damage. DIAGNOSIS  Your health care provider will do a physical exam and ask about your symptoms. Various tests may be ordered, such as: X-rays of the affected joint. An MRI scan. Blood tests to rule out other types of arthritis. Joint fluid tests. This involves using a needle to draw fluid from the joint and examining the fluid under a microscope. TREATMENT  Goals of treatment are to control pain and improve joint function. Treatment plans may include: A prescribed exercise program that allows for rest and joint relief. A weight control plan. Pain relief techniques, such as: Properly applied heat and cold. Electric pulses delivered to nerve endings under the skin (transcutaneous electrical nerve stimulation, TENS). Massage. Certain nutritional supplements. Medicines to control pain, such as: Acetaminophen. Nonsteroidal  anti-inflammatory drugs (NSAIDs), such as naproxen. Narcotic or central-acting agents, such as tramadol. Corticosteroids. These can be given orally or as an injection. Surgery to reposition the bones and relieve pain (osteotomy) or to remove loose pieces of bone and cartilage. Joint replacement may be needed in advanced states of osteoarthritis. HOME CARE INSTRUCTIONS  Only take over-the-counter or prescription medicines as directed by your health care provider. Take all medicines exactly as instructed. Maintain a healthy weight. Follow your health care provider's instructions for weight control. This may include dietary instructions. Exercise as directed. Your health care provider can recommend specific types of exercise. These may include: Strengthening exercises These are done to strengthen the muscles that support joints affected by arthritis. They can be performed with weights or with exercise bands to add resistance. Aerobic activities These are exercises, such as brisk walking or low-impact aerobics, that get your heart pumping. Range-of-motion activities These keep your joints limber. Balance and agility exercises These help you maintain daily living skills. Rest your affected joints as directed by your health care provider. Follow up with your health care provider as directed. SEEK MEDICAL CARE IF:  Your skin turns red. You develop a rash in addition to your joint pain. You have worsening joint pain. SEEK IMMEDIATE MEDICAL CARE IF: You have a significant loss of weight or appetite. You have a fever along with joint or muscle aches. You have night sweats. [place] of Arthritis and Musculoskeletal and Skin Diseases: www.niams.SouthExposed.es [employer] on Aging: http://kim-miller.com/ American College of Rheumatology: www.rheumatology.org Document Released: 05/01/2005 Document Revised: 02/19/2013 Document Reviewed: 01/06/2013 [facility] Patient Information  2014 [place], [place]. Pelvic Pain, Female  Female pelvic pain can be caused by many different things and start from a variety of places. Pelvic pain refers to pain that is located in the lower half of the abdomen and between your hips. The pain may occur over a short period of time (acute) or may be reoccurring (chronic). The cause of pelvic pain may be related to disorders affecting the female reproductive organs (gynecologic), but it may also be related to the bladder, kidney stones, an intestinal complication, or muscle or skeletal problems. Getting help right away for pelvic pain is important, especially if there has been severe, sharp, or a sudden onset of unusual pain. It is also important to get help right away because some types of pelvic pain can be life threatening.  CAUSES  Below are only some of the causes of pelvic pain. The causes of pelvic pain can be in one of several categories.   Gynecologic.  Pelvic inflammatory disease.  Sexually transmitted infection.  Ovarian cyst or a twisted ovarian ligament (ovarian torsion).  Uterine lining that grows outside the uterus (endometriosis).  Fibroids, cysts, or tumors.  Ovulation.  Pregnancy.  Pregnancy that occurs outside the uterus (ectopic pregnancy).  Miscarriage.  Labor.  Abruption of the placenta or ruptured uterus.  Infection.  Uterine infection (endometritis).  Bladder infection.  Diverticulitis.  Miscarriage related to a uterine infection (septic abortion).  Bladder.  Inflammation of the bladder (cystitis).  Kidney stone(s).  Gastrointenstinal.  Constipation.  Diverticulitis.  Neurologic.  Trauma.  Feeling pelvic pain because of mental or emotional causes (psychosomatic).  Cancers of the bowel or pelvis. EVALUATION  Your caregiver will want to take a careful history of your concerns. This includes recent changes in your health, a careful gynecologic history of your periods (menses), and a sexual  history. Obtaining your family history and medical history is also important. Your caregiver may suggest a pelvic exam. A pelvic exam will help identify the location and severity of the pain. It also helps in the evaluation of which organ system may be involved. In order to identify the cause of the pelvic pain and be properly treated, your caregiver may order tests. These tests may include:   A pregnancy test.  Pelvic ultrasonography.  An X-ray exam of the abdomen.  A urinalysis or evaluation of vaginal discharge.  Blood tests. HOME CARE INSTRUCTIONS   Only take over-the-counter or prescription medicines for pain, discomfort, or fever as directed by your caregiver.   Rest as directed by your caregiver.   Eat a balanced diet.   Drink enough fluids to make your urine clear or pale yellow, or as directed.   Avoid sexual intercourse if it causes pain.   Apply warm or cold compresses to the lower abdomen depending on which one helps the pain.   Avoid stressful situations.   Keep a journal of your pelvic pain. Write down when it started, where the pain is located, and if there are things that seem to be associated with the pain, such as food or your menstrual cycle.  Follow up with your caregiver as directed.  SEEK MEDICAL CARE IF:  Your medicine does not help your pain.  You have abnormal vaginal discharge. SEEK IMMEDIATE MEDICAL CARE IF:   You have heavy bleeding from the vagina.   Your pelvic pain increases.   You feel lightheaded or faint.   You have chills.   You have pain with urination or blood in your urine.   You have uncontrolled diarrhea or vomiting.  You have a fever or persistent symptoms for more than 3 days.  You have a fever and your symptoms suddenly get worse.   You are being physically or sexually abused.  MAKE SURE YOU:  Understand these instructions.  Will watch your condition.  Will get help if you are not doing well or get  worse. Document Released: 03/28/2004 Document Revised: 10/31/2011 Document Reviewed: 08/21/2011 [facility] Patient Information 2014 [place], [place]. Take uribel and motrin and return in 1 week for [place]

## 2013-06-17 NOTE — Progress Notes (Signed)
Subjective:     Patient ID: Mary Oconnell, female   DOB: 1965-12-12, 48 y.o.   MRN: [id number]  HPI Glory is a 48 year old white female in complaining of urinary frequency and burns in vagina and some tenderness RLQ, felt better when on macrobid.Has pain and swelling at joint right 5th finger, no known injury but other fingers stiff too.Up 3-4 times when sleeps to pee, works 3rd shift, still using vivelle patch.Also has low back pain.  Review of Systems See HPI Reviewed past medical,surgical, social and family history. Reviewed medications and allergies.     Objective:   Physical Exam BP 112/60  Ht _0  (1.651 m)  Wt 147 lb (66.679 kg)  BMI 24.46 kg/m2urine negative, Skin warm and dry.Pelvic: external genitalia is normal in appearance, vagina:pink, moist no discharge, cervix and uterus are absent, adnexa: no masses, has tenderness over RLQ.No CVAT noted.   5th right finger swollen at mid joint, tender when palpated, not red or hot like gout, ?OA, as other joints slightly enlarged on other fingers same hand.  Assessment:     Urinary frequency, ?IC RLQ pain OA fingers    Plan:     Rx uribel #56 1 qid x 14 days with 1 refill Rx motrin 800 mg #60 1 every 8 hours prn with 1 refill   Return in 1 week for [place] and see me Check CBC,CMP,TSH, lipids and ESR Urine sent for UA C&S Review handout on pelvic pain and OA

## 2013-06-18 ENCOUNTER — Telehealth: Payer: Self-pay | Admitting: Adult Health

## 2013-06-18 LAB — URINALYSIS
BILIRUBIN URINE: NEGATIVE
GLUCOSE, UA: NEGATIVE mg/dL
Hgb urine dipstick: NEGATIVE
Ketones, ur: NEGATIVE mg/dL
LEUKOCYTES UA: NEGATIVE
Nitrite: NEGATIVE
PROTEIN: NEGATIVE mg/dL
SPECIFIC GRAVITY, URINE: 1.013 (ref 1.005–1.030)
Urobilinogen, UA: 0.2 mg/dL (ref 0.0–1.0)
pH: 6.5 (ref 5.0–8.0)

## 2013-06-18 LAB — TSH: TSH: 10.703 u[IU]/mL — ABNORMAL HIGH (ref 0.350–4.500)

## 2013-06-18 NOTE — Telephone Encounter (Signed)
Pt aware of labs, TSH 10.703 and she is allergic to synthroid, will talk with endocrinologist

## 2013-06-19 LAB — URINE CULTURE
Colony Count: NO GROWTH
Organism ID, Bacteria: NO GROWTH

## 2013-06-20 ENCOUNTER — Telehealth: Payer: Self-pay | Admitting: Adult Health

## 2013-06-20 MED ORDER — LEVOTHYROXINE SODIUM 13 MCG PO CAPS: 1.0000 | ORAL_CAPSULE | Freq: Every day | ORAL | Status: DC

## 2013-06-20 NOTE — Telephone Encounter (Signed)
Pt states thinks the pain she is having is from a "pulled muscle," saw Derrek Monaco, NP this week. Requesting Anderson Malta to give her something for pain to see if it helps, "something that doesn't make the pt sleepy." Pt states has an appt next week for U/S and to see Anderson Malta.

## 2013-06-20 NOTE — Telephone Encounter (Signed)
Will rx tirosint per Dr Dorris Fetch, I discussed this with him.keep appt next week Rest no meds for muscle

## 2013-06-23 ENCOUNTER — Telehealth: Payer: Self-pay | Admitting: *Deleted

## 2013-06-23 NOTE — Telephone Encounter (Signed)
Pt states a "knot on breast no breast discharge" does pt need to be seen before her appt on 06/23/2013. Pt informed to keep her appt with Derrek Monaco, NP on 06/24/2103.

## 2013-06-26 ENCOUNTER — Ambulatory Visit (INDEPENDENT_AMBULATORY_CARE_PROVIDER_SITE_OTHER): Payer: BC Managed Care – PPO

## 2013-06-26 ENCOUNTER — Other Ambulatory Visit: Payer: Self-pay | Admitting: Adult Health

## 2013-06-26 ENCOUNTER — Encounter: Payer: Self-pay | Admitting: Adult Health

## 2013-06-26 ENCOUNTER — Ambulatory Visit (INDEPENDENT_AMBULATORY_CARE_PROVIDER_SITE_OTHER): Payer: BC Managed Care – PPO | Admitting: Adult Health

## 2013-06-26 VITALS — BP 100/60 | Ht 65.2 in | Wt 145.0 lb

## 2013-06-26 DIAGNOSIS — N63 Unspecified lump in unspecified breast: Secondary | ICD-10-CM

## 2013-06-26 DIAGNOSIS — R1031 Right lower quadrant pain: Secondary | ICD-10-CM

## 2013-06-26 DIAGNOSIS — K5900 Constipation, unspecified: Secondary | ICD-10-CM

## 2013-06-26 DIAGNOSIS — E039 Hypothyroidism, unspecified: Secondary | ICD-10-CM

## 2013-06-26 HISTORY — DX: Hypothyroidism, unspecified: E03.9

## 2013-06-26 MED ORDER — LINACLOTIDE 145 MCG PO CAPS: 145.0000 ug | ORAL_CAPSULE | Freq: Every day | ORAL | Status: DC

## 2013-06-26 NOTE — Patient Instructions (Signed)
Hypothyroidism The thyroid is a large gland located in the lower front of your neck. The thyroid gland helps control metabolism. Metabolism is how your body handles food. It controls metabolism with the hormone thyroxine. When this gland is underactive (hypothyroid), it produces too little hormone.  CAUSES These include:   Absence or destruction of thyroid tissue.  Goiter due to iodine deficiency.  Goiter due to medications.  Congenital defects (since birth).  Problems with the pituitary. This causes a lack of TSH (thyroid stimulating hormone). This hormone tells the thyroid to turn out more hormone. SYMPTOMS  Lethargy (feeling as though you have no energy)  Cold intolerance  Weight gain (in spite of normal food intake)  Dry skin  Coarse hair  Menstrual irregularity (if severe, may lead to infertility)  Slowing of thought processes Cardiac problems are also caused by insufficient amounts of thyroid hormone. Hypothyroidism in the newborn is cretinism, and is an extreme form. It is important that this form be treated adequately and immediately or it will lead rapidly to retarded physical and mental development. DIAGNOSIS  To prove hypothyroidism, your caregiver may do blood tests and ultrasound tests. Sometimes the signs are hidden. It may be necessary for your caregiver to watch this illness with blood tests either before or after diagnosis and treatment. TREATMENT  Low levels of thyroid hormone are increased by using synthetic thyroid hormone. This is a safe, effective treatment. It usually takes about four weeks to gain the full effects of the medication. After you have the full effect of the medication, it will generally take another four weeks for problems to leave. Your caregiver may start you on low doses. If you have had heart problems the dose may be gradually increased. It is generally not an emergency to get rapidly to normal. HOME CARE INSTRUCTIONS   Take your  medications as your caregiver suggests. Let your caregiver know of any medications you are taking or start taking. Your caregiver will help you with dosage schedules.  As your condition improves, your dosage needs may increase. It will be necessary to have continuing blood tests as suggested by your caregiver.  Report all suspected medication side effects to your caregiver. SEEK MEDICAL CARE IF: Seek medical care if you develop:  Sweating.  Tremulousness (tremors).  Anxiety.  Rapid weight loss.  Heat intolerance.  Emotional swings.  Diarrhea.  Weakness. SEEK IMMEDIATE MEDICAL CARE IF:  You develop chest pain, an irregular heart beat (palpitations), or a rapid heart beat. MAKE SURE YOU:   Understand these instructions.  Will watch your condition.  Will get help right away if you are not doing well or get worse. Document Released: 05/01/2005 Document Revised: 07/24/2011 Document Reviewed: 12/20/2007 [facility] Patient Information 2014 [place]. Constipation, Adult Constipation is when a person has fewer than 3 bowel movements a week; has difficulty having a bowel movement; or has stools that are dry, hard, or larger than normal. As people grow older, constipation is more common. If you try to fix constipation with medicines that make you have a bowel movement (laxatives), the problem may get worse. Long-term laxative use may cause the muscles of the colon to become weak. A low-fiber diet, not taking in enough fluids, and taking certain medicines may make constipation worse. CAUSES   Certain medicines, such as antidepressants, pain medicine, iron supplements, antacids, and water pills.   Certain diseases, such as diabetes, irritable bowel syndrome (IBS), thyroid disease, or depression.   Not drinking enough water.  Not eating enough fiber-rich foods.   Stress or travel.  Lack of physical activity or exercise.  Not going to the restroom when there is the urge  to have a bowel movement.  Ignoring the urge to have a bowel movement.  Using laxatives too much. SYMPTOMS   Having fewer than 3 bowel movements a week.   Straining to have a bowel movement.   Having hard, dry, or larger than normal stools.   Feeling full or bloated.   Pain in the lower abdomen.  Not feeling relief after having a bowel movement. DIAGNOSIS  Your caregiver will take a medical history and perform a physical exam. Further testing may be done for severe constipation. Some tests may include:   A barium enema X-ray to examine your rectum, colon, and sometimes, your small intestine.  A sigmoidoscopy to examine your lower colon.  A colonoscopy to examine your entire colon. TREATMENT  Treatment will depend on the severity of your constipation and what is causing it. Some dietary treatments include drinking more fluids and eating more fiber-rich foods. Lifestyle treatments may include regular exercise. If these diet and lifestyle recommendations do not help, your caregiver may recommend taking over-the-counter laxative medicines to help you have bowel movements. Prescription medicines may be prescribed if over-the-counter medicines do not work.  HOME CARE INSTRUCTIONS   Increase dietary fiber in your diet, such as fruits, vegetables, whole grains, and beans. Limit high-fat and processed sugars in your diet, such as [place] fries, hamburgers, cookies, candies, and soda.   A fiber supplement may be added to your diet if you cannot get enough fiber from foods.   Drink enough fluids to keep your urine clear or pale yellow.   Exercise regularly or as directed by your caregiver.   Go to the restroom when you have the urge to go. Do not hold it.  Only take medicines as directed by your caregiver. Do not take other medicines for constipation without talking to your caregiver first. [place] IF:   You have bright red blood in your stool.   Your  constipation lasts for more than 4 days or gets worse.   You have abdominal or rectal pain.   You have thin, pencil-like stools.  You have unexplained weight loss. MAKE SURE YOU:   Understand these instructions.  Will watch your condition.  Will get help right away if you are not doing well or get worse. Document Released: 01/28/2004 Document Revised: 07/24/2011 Document Reviewed: 02/10/2013 [facility] Patient Information 2014 [place], [place]. Try linzess Return in 8 weeks for labs and see me Get mammogram 3/12 at 8:45 am

## 2013-06-26 NOTE — Progress Notes (Signed)
Subjective:     Patient ID: Mary Oconnell, female   DOB: [birth year], 48 y.o.   MRN: [id number]  HPI Mary Oconnell is back for [place] for RLQ pain.Has not got meds for hypothyroid yet.And has noticed a mass in right breast that is tender.  Review of Systems See HPI    Objective:   Physical Exam BP 100/60  Ht 5' 5.2" (1.656 m)  Wt 145 lb (65.772 kg)  BMI 23.98 kg/m2   reviewed US Uterus Surgically absent Vag Cuff- appears WNL  Endometrium --------------------  Right ovary 2.0 x 1.2 x 1.2 cm,  Left ovary 1.9 x 1.2 x 1.1 cm,  No free fluid or adnexal masses noted within pelvis  Technician Comments:  Bilateral adnexa/ovaries & vaginal cuff appears WNL, Prominent bowel noted bilaterally periostalising and tender to palp with abdominal probe pressure ?hx IBS, does have constipation, will try linzess. Skin warm and dry,  Breasts:no dominate palpable mass, retraction or nipple discharge on left on right, no retraction or nipple discharge, has round, mobile, tender mass at 10O'clock 2 fb from nipple, will get mammogram and [place]    Assessment:     RLQ pain Right breast mass Constipated Hypothyroid     Plan:     Get thyroid meds today Trial linzess Number of samples 16 tabs  Lot number [id number]     Exp date 7/15, if no better refer to GI   Follow up in 8 weeks for labs and see me Diagnostic mammogram and right breast US 3/12 [facility] 8[facility]

## 2013-07-09 ENCOUNTER — Telehealth: Payer: Self-pay | Admitting: Adult Health

## 2013-07-11 MED ORDER — PHENAZOPYRIDINE HCL 200 MG PO TABS: 200.0000 mg | ORAL_TABLET | Freq: Three times a day (TID) | ORAL | Status: DC

## 2013-07-11 NOTE — Telephone Encounter (Signed)
Says pyridium helps, she stops taking burns, probably IC make appt with Dr Elonda Husky, will refill pyridium til then

## 2013-07-11 NOTE — Telephone Encounter (Signed)
Continues to c/o burning with urination, and frequency, but pyridium helps. Pt states does not know if Mary Oconnell wants to refill the pyridium or give her something else.  Requesting samples or RX for linzess for IBS.

## 2013-07-18 ENCOUNTER — Telehealth: Payer: Self-pay | Admitting: Adult Health

## 2013-07-18 ENCOUNTER — Ambulatory Visit: Payer: BC Managed Care – PPO | Admitting: Obstetrics & Gynecology

## 2013-07-18 NOTE — Telephone Encounter (Signed)
Pt requesting Minivelle patches, samples left at front desk for pt to pick up.

## 2013-07-21 ENCOUNTER — Telehealth: Payer: Self-pay | Admitting: Adult Health

## 2013-07-21 NOTE — Telephone Encounter (Signed)
Don't take any more meds make appt  To talk, she did say she felt better on it, but had shortness of breath and heart flutters, will send to Dr Dorris Fetch

## 2013-07-21 NOTE — Telephone Encounter (Signed)
Pt states stopped taking the levothyroxine 13 mcg on Saturday because it made her feel like "she could not breath, but now having the blues." Please advise.

## 2013-07-23 ENCOUNTER — Ambulatory Visit: Payer: BC Managed Care – PPO | Admitting: Obstetrics & Gynecology

## 2013-07-24 ENCOUNTER — Ambulatory Visit
Admission: RE | Admit: 2013-07-24 | Discharge: 2013-07-24 | Disposition: A | Discharge status: Home / Self Care | Payer: BC Managed Care – PPO | Source: Ambulatory Visit | Attending: Adult Health | Admitting: Adult Health

## 2013-07-24 DIAGNOSIS — N63 Unspecified lump in unspecified breast: Secondary | ICD-10-CM

## 2013-07-25 ENCOUNTER — Encounter: Payer: Self-pay | Admitting: Adult Health

## 2013-07-25 ENCOUNTER — Ambulatory Visit (INDEPENDENT_AMBULATORY_CARE_PROVIDER_SITE_OTHER): Payer: BC Managed Care – PPO | Admitting: Adult Health

## 2013-07-25 VITALS — BP 110/60 | Ht 65.2 in | Wt 146.0 lb

## 2013-07-25 DIAGNOSIS — F411 Generalized anxiety disorder: Secondary | ICD-10-CM

## 2013-07-25 DIAGNOSIS — F419 Anxiety disorder, unspecified: Secondary | ICD-10-CM

## 2013-07-25 DIAGNOSIS — E039 Hypothyroidism, unspecified: Secondary | ICD-10-CM

## 2013-07-25 MED ORDER — LORAZEPAM 0.5 MG PO TABS: 0.5000 mg | ORAL_TABLET | Freq: Three times a day (TID) | ORAL | Status: DC

## 2013-07-25 NOTE — Progress Notes (Signed)
Subjective:     Patient ID: Mary Oconnell, female   DOB: [birth year], 48 y.o.   MRN: [id number]  HPI Mary Oconnell is in today because she had to stop levothyroxine due to side effects, will refer to Dr Dorris Fetch.  Review of Systems See HPI Reviewed past medical,surgical, social and family history. Reviewed medications and allergies.     Objective:   Physical Exam BP 110/60  Ht 5' 5.2" (1.656 m)  Wt 146 lb (66.225 kg)  BMI 24.15 kg/m2   She stopped levothyroxine 13 mcg due to heart racing, chest heavy and speech fast, but did feel better when taking it, needs refill on ativan and says bladder better but needs to reschedule appt to see Dr Elonda Husky Assessment:     Hypothyroid Anxiety     Plan:     Refilled ativan 0.5 mg #30 with 1 refill Refer to Dr Dorris Fetch Schedule appt with Dr Elonda Husky ?IC   Review handout on IC

## 2013-07-25 NOTE — Patient Instructions (Signed)
Will refer to Dr Dorris Fetch Follow up with Dr Elonda Husky regarding ?IC Interstitial Cystitis Interstitial cystitis (IC) is a condition that results in discomfort or pain in the bladder and the surrounding pelvic region. The symptoms can be different from case to case and even in the same individual. People may experience:  Mild discomfort.  Pressure.  Tenderness.  Intense pain in the bladder and pelvic area. CAUSES  Because IC varies so much in symptoms and severity, people studying this disease believe it is not one but several diseases. Some caregivers use the term painful bladder syndrome (PBS) to describe cases with painful urinary symptoms. This may not meet the strictest definition of IC. The term IC / PBS includes all cases of urinary pain that cannot be connected to other causes, such as infection or urinary stones.  SYMPTOMS  Symptoms may include:  An urgent need to urinate.  A frequent need to urinate.  A combination of these symptoms. Pain may change in intensity as the bladder fills with urine or as it empties. Women's symptoms often get worse during menstruation. They may sometimes experience pain with vaginal intercourse. Some of the symptoms of IC / PBS seem like those of bacterial infection. Tests do not show infection. IC / PBS is far more common in women than in men.  DIAGNOSIS  The diagnosis of IC / PBS is based on:  Presence of pain related to the bladder, usually along with problems of frequency and urgency.  Not finding other diseases that could cause the symptoms.  Diagnostic tests that help rule out other diseases include:  Urinalysis.  Urine culture.  Cystoscopy.  Biopsy of the bladder wall.  Distension of the bladder under anesthesia.  Urine cytology.  Laboratory examination of prostate secretions. A biopsy is a tissue sample that can be looked at under a microscope. Samples of the bladder and urethra may be removed during a cystoscopy. A biopsy helps rule  out bladder cancer. TREATMENT  Scientists have not yet found a cure for IC / PBS. Patients with IC / PBS do not get better with antibiotic therapy. Caregivers cannot predict who will respond best to which treatment. Symptoms may disappear without explanation. Disappearing symptoms may coincide with an event such as a change in diet or treatment. Even when symptoms disappear, they may return after days, weeks, months, or years.  Because the causes of IC / PBS are unknown, current treatments are aimed at relieving symptoms. Many people are helped by one or a combination of the treatments. As researchers learn more about IC / PBS, the list of potential treatments will change. Patients should discuss their options with a caregiver. SURGERY  Surgery should be considered only if all available treatments have failed and the pain is disabling. Many approaches and techniques are used. Each approach has its own advantages and complications. Advantages and complications should be discussed with a urologist. Your caregiver may recommend consulting another urologist for a second opinion. Most caregivers are reluctant to operate because the outcome is unpredictable. Some people still have symptoms after surgery.  People considering surgery should discuss the potential risks and benefits, side effects, and long- and short-term complications with their family, as well as with people who have already had the procedure. Surgery requires anesthesia, hospitalization, and in some cases weeks or months of recovery. As the complexity of the procedure increases, so do the chances for complications and for failure. HOME CARE INSTRUCTIONS   All drugs, even those sold over the counter,  have side effects. Patients should always consult a caregiver before using any drug for an extended amount of time. Only take over-the-counter or prescription medicines for pain, discomfort, or fever as directed by your caregiver.  Many patients  feel that smoking makes their symptoms worse. How the by-products of tobacco that are excreted in the urine affect IC / PBS is unknown. Smoking is the major known cause of bladder cancer. One of the best things smokers can do for their bladder and their overall health is to quit.  Many patients feel that gentle stretching exercises help relieve IC / PBS symptoms.  Methods vary, but basically patients decide to empty their bladder at designated times and use relaxation techniques and distractions to keep to the schedule. Gradually, patients try to lengthen the time between scheduled voids. A diary in which to record voiding times is usually helpful in keeping track of progress. MAKE SURE YOU:   Understand these instructions.  Will watch your condition.  Will get help right away if you are not doing well or get worse. Document Released: 12/31/2003 Document Revised: 07/24/2011 Document Reviewed: 03/16/2008 [facility] Patient Information 2014 [place], [place].

## 2013-07-30 ENCOUNTER — Ambulatory Visit: Payer: BC Managed Care – PPO | Admitting: Obstetrics & Gynecology

## 2013-08-11 ENCOUNTER — Telehealth: Payer: Self-pay | Admitting: Adult Health

## 2013-08-11 NOTE — Telephone Encounter (Signed)
Pt given samples of Linzess 176mcg and Minivelle 0.1mg . Derrek Monaco out of the office this week. Pt aware to pick them up at front desk

## 2013-08-19 ENCOUNTER — Ambulatory Visit: Payer: BC Managed Care – PPO | Admitting: Obstetrics & Gynecology

## 2013-08-21 ENCOUNTER — Ambulatory Visit: Payer: BC Managed Care – PPO | Admitting: Adult Health

## 2013-09-08 ENCOUNTER — Telehealth: Payer: Self-pay | Admitting: Adult Health

## 2013-09-08 MED ORDER — LINACLOTIDE 145 MCG PO CAPS: 145.0000 ug | ORAL_CAPSULE | Freq: Every day | ORAL | Status: DC

## 2013-09-08 NOTE — Telephone Encounter (Signed)
Pt states Linzess working well, requesting prescription to be sent to pharmacy.

## 2013-09-18 ENCOUNTER — Telehealth: Payer: Self-pay | Admitting: *Deleted

## 2013-09-18 MED ORDER — LORAZEPAM 0.5 MG PO TABS: 0.5000 mg | ORAL_TABLET | Freq: Three times a day (TID) | ORAL | Status: DC

## 2013-09-18 NOTE — Telephone Encounter (Signed)
Pt states that she needs a refill on her Ativan. Pt states that she takes them 3 times a day and she will run out on the 10th. Pt advised that JAG is out of the office until Monday, and that I would ask another provider to fill the Rx, but I couldn't promise anything. Pt verbalized understanding.  I spoke with Dr. Elonda Husky and he gave verbal order to refill the pt's Ativan.   Pt aware of refill and will fax to [facility].

## 2013-11-10 ENCOUNTER — Telehealth: Payer: Self-pay | Admitting: Adult Health

## 2013-11-10 NOTE — Telephone Encounter (Signed)
Pt requesting samples for Vivelle.dot. Pt informed do not have samples but have discount card left at front desk for pt to pick up.

## 2014-01-05 ENCOUNTER — Ambulatory Visit: Payer: BC Managed Care – PPO | Admitting: Adult Health

## 2014-01-08 ENCOUNTER — Ambulatory Visit: Payer: BC Managed Care – PPO | Admitting: Adult Health

## 2014-01-13 ENCOUNTER — Ambulatory Visit: Payer: BC Managed Care – PPO | Admitting: Adult Health

## 2014-02-13 ENCOUNTER — Other Ambulatory Visit (HOSPITAL_COMMUNITY): Payer: Self-pay | Admitting: Physician Assistant

## 2014-02-13 DIAGNOSIS — R519 Headache, unspecified: Secondary | ICD-10-CM

## 2014-02-13 DIAGNOSIS — M6281 Muscle weakness (generalized): Secondary | ICD-10-CM

## 2014-02-13 DIAGNOSIS — R2689 Other abnormalities of gait and mobility: Secondary | ICD-10-CM

## 2014-02-13 DIAGNOSIS — R51 Headache: Secondary | ICD-10-CM

## 2014-02-18 ENCOUNTER — Ambulatory Visit (HOSPITAL_COMMUNITY): Payer: BC Managed Care – PPO

## 2014-02-20 ENCOUNTER — Ambulatory Visit (HOSPITAL_COMMUNITY)
Admission: RE | Admit: 2014-02-20 | Discharge: 2014-02-20 | Disposition: A | Discharge status: Home / Self Care | Payer: BC Managed Care – PPO | Source: Ambulatory Visit | Attending: Physician Assistant | Admitting: Physician Assistant

## 2014-02-20 DIAGNOSIS — R200 Anesthesia of skin: Secondary | ICD-10-CM | POA: Diagnosis not present

## 2014-02-20 DIAGNOSIS — R519 Headache, unspecified: Secondary | ICD-10-CM

## 2014-02-20 DIAGNOSIS — Z820 Family history of epilepsy and other diseases of the nervous system: Secondary | ICD-10-CM | POA: Diagnosis not present

## 2014-02-20 DIAGNOSIS — R51 Headache: Secondary | ICD-10-CM | POA: Insufficient documentation

## 2014-02-20 DIAGNOSIS — M6281 Muscle weakness (generalized): Secondary | ICD-10-CM

## 2014-02-20 DIAGNOSIS — R2689 Other abnormalities of gait and mobility: Secondary | ICD-10-CM

## 2014-02-20 DIAGNOSIS — R42 Dizziness and giddiness: Secondary | ICD-10-CM | POA: Diagnosis not present

## 2014-02-20 DIAGNOSIS — R531 Weakness: Secondary | ICD-10-CM | POA: Diagnosis not present

## 2014-02-20 MED ORDER — GADOBENATE DIMEGLUMINE 529 MG/ML IV SOLN
13.0000 mL | Freq: Once | INTRAVENOUS | Status: AC | PRN
Start: 2014-02-20 — End: 2014-02-20
  Administered 2014-02-20: 13 mL via INTRAVENOUS

## 2014-02-27 ENCOUNTER — Telehealth: Payer: Self-pay | Admitting: Adult Health

## 2014-02-27 NOTE — Telephone Encounter (Signed)
Pt informed we do not have vivelle dot samples, can schedule an appt and discuss switching medications per Derrek Monaco, NP.

## 2014-03-02 ENCOUNTER — Other Ambulatory Visit: Payer: Self-pay | Admitting: *Deleted

## 2014-03-02 MED ORDER — ESTRADIOL 0.1 MG/24HR TD PTTW: 1.0000 | MEDICATED_PATCH | TRANSDERMAL | Status: DC

## 2014-03-16 ENCOUNTER — Encounter: Payer: Self-pay | Admitting: Adult Health

## 2014-04-07 ENCOUNTER — Ambulatory Visit: Payer: BC Managed Care – PPO | Admitting: Neurology

## 2014-05-25 ENCOUNTER — Telehealth: Payer: Self-pay | Admitting: Neurology

## 2014-05-25 ENCOUNTER — Ambulatory Visit: Payer: BC Managed Care – PPO | Admitting: Neurology

## 2014-05-25 NOTE — Telephone Encounter (Signed)
Pt called and states that she could not come in today due to the fact that she was really dizzy and was scared to drive

## 2014-05-26 NOTE — Telephone Encounter (Signed)
Per note in EPIC, we can reschedule the 05/25/14 appt x1.   05/25/14 appt will still be considered a no show b/c the pt called less than 24 hours prior to appt but a letter will not be sent.   Hinton Dyer - this appointment has not been rescheduled yet - do we need to call patient to reschedule? If the patient is not ready to reschedule we need to alert referring provider office that appointment was not kept and has not been rescheduled. Please check into this / Sherri S.

## 2014-05-26 NOTE — Telephone Encounter (Signed)
i have called patient and left several messages for the patient to call back to resch appt. I have also called the referring dr office and let them know she did not keep her appt or resch appt Mary Oconnell

## 2014-06-01 ENCOUNTER — Ambulatory Visit (INDEPENDENT_AMBULATORY_CARE_PROVIDER_SITE_OTHER): Payer: BLUE CROSS/BLUE SHIELD | Admitting: Adult Health

## 2014-06-01 ENCOUNTER — Encounter: Payer: Self-pay | Admitting: Adult Health

## 2014-06-01 VITALS — BP 122/72 | HR 78 | Ht 65.0 in | Wt 155.5 lb

## 2014-06-01 DIAGNOSIS — K5900 Constipation, unspecified: Secondary | ICD-10-CM

## 2014-06-01 DIAGNOSIS — Z1212 Encounter for screening for malignant neoplasm of rectum: Secondary | ICD-10-CM

## 2014-06-01 DIAGNOSIS — Z9223 Personal history of estrogen therapy: Secondary | ICD-10-CM

## 2014-06-01 DIAGNOSIS — Z01419 Encounter for gynecological examination (general) (routine) without abnormal findings: Secondary | ICD-10-CM

## 2014-06-01 DIAGNOSIS — R1031 Right lower quadrant pain: Secondary | ICD-10-CM

## 2014-06-01 HISTORY — DX: Right lower quadrant pain: R10.31

## 2014-06-01 LAB — HEMOCCULT GUIAC POC 1CARD (OFFICE): FECAL OCCULT BLD: NEGATIVE

## 2014-06-01 MED ORDER — LINACLOTIDE 145 MCG PO CAPS: 145.0000 ug | ORAL_CAPSULE | Freq: Every day | ORAL | Status: DC

## 2014-06-01 MED ORDER — ESTRADIOL 0.1 MG/24HR TD PTTW: 1.0000 | MEDICATED_PATCH | TRANSDERMAL | Status: DC

## 2014-06-01 NOTE — Progress Notes (Signed)
Patient ID: Mary Oconnell, female   DOB: 10-Oct-1965, 49 y.o.   MRN: [id number] History of Present Illness: Mary Oconnell is a 49 year old white female in for gyn exam,she complains of decrease libido and pain that comes and goes in RLQ.She currently has ?vertigo, since before Christmas and sees Dr Benjamine Mola today at 4 pm.Constipation much better with linzess.Had elevated TSH last year, had ?reaction to synthroid and had labs rechecked at PCP and was normal.   Current Medications, Allergies, Past Medical History, Past Surgical History, Family History and Social History were reviewed in [employer] record.     Review of Systems: patient denies any headaches, blurred vision, shortness of breath, chest pain, abdominal pain, problems with bowel movements, urination, or intercourse. No joint pain or mood swings, see HPI for positives.    Physical Exam:BP 122/72 mmHg  Pulse 78  Ht 5\' 5"  (1.651 m)  Wt 155 lb 8 oz (70.534 kg)  BMI 25.88 kg/m2 General:  Well developed, well nourished, no acute distress Skin:  Warm and dry Neck:  Midline trachea, normal thyroid Lungs; Clear to auscultation bilaterally Breast:  No dominant palpable mass, retraction, or nipple discharge Cardiovascular: Regular rate and rhythm Abdomen:  Soft, non tender, no hepatosplenomegaly Pelvic:  External genitalia is normal in appearance,no lesions.  The vagina is normal in appearance, with good color and moisture, some loss of rugae. The cervix and uterus are absent.  No                adnexal masses, RLQ tenderness noted. Rectal: Good sphincter tone, no polyps, or hemorrhoids felt.  Hemoccult negative. Extremities:  No swelling or varicosities noted Psych:  No mood changes,alert and cooperative,seems happy Discussed could change ET to include testosterone but will wait til other issues assessed and better.  Impression: Well woman gyn exam no pap RLQ pain History of ET Constipation  Decreased  libido   Plan: Refilled linzes 145 mcg #30 1 daily with 11 refills Refilled minivelle 0.1mg  x 1 year Mammogram in March Return in 1 week for gyn [place] and see me Physical in 1 year  Review handout on pelvic pain, may be cyst

## 2014-06-01 NOTE — Patient Instructions (Signed)
Mammogram in march Physical in 1 year Return in 1 week for [place] and see me Pelvic Pain Female pelvic pain can be caused by many different things and start from a variety of places. Pelvic pain refers to pain that is located in the lower half of the abdomen and between your hips. The pain may occur over a short period of time (acute) or may be reoccurring (chronic). The cause of pelvic pain may be related to disorders affecting the female reproductive organs (gynecologic), but it may also be related to the bladder, kidney stones, an intestinal complication, or muscle or skeletal problems. Getting help right away for pelvic pain is important, especially if there has been severe, sharp, or a sudden onset of unusual pain. It is also important to get help right away because some types of pelvic pain can be life threatening.  CAUSES  Below are only some of the causes of pelvic pain. The causes of pelvic pain can be in one of several categories.   Gynecologic.  Pelvic inflammatory disease.  Sexually transmitted infection.  Ovarian cyst or a twisted ovarian ligament (ovarian torsion).  Uterine lining that grows outside the uterus (endometriosis).  Fibroids, cysts, or tumors.  Ovulation.  Pregnancy.  Pregnancy that occurs outside the uterus (ectopic pregnancy).  Miscarriage.  Labor.  Abruption of the placenta or ruptured uterus.  Infection.  Uterine infection (endometritis).  Bladder infection.  Diverticulitis.  Miscarriage related to a uterine infection (septic abortion).  Bladder.  Inflammation of the bladder (cystitis).  Kidney stone(s).  Gastrointestinal.  Constipation.  Diverticulitis.  Neurologic.  Trauma.  Feeling pelvic pain because of mental or emotional causes (psychosomatic).  Cancers of the bowel or pelvis. EVALUATION  Your caregiver will want to take a careful history of your concerns. This includes recent changes in your health, a careful gynecologic  history of your periods (menses), and a sexual history. Obtaining your family history and medical history is also important. Your caregiver may suggest a pelvic exam. A pelvic exam will help identify the location and severity of the pain. It also helps in the evaluation of which organ system may be involved. In order to identify the cause of the pelvic pain and be properly treated, your caregiver may order tests. These tests may include:   A pregnancy test.  Pelvic ultrasonography.  An X-ray exam of the abdomen.  A urinalysis or evaluation of vaginal discharge.  Blood tests. HOME CARE INSTRUCTIONS   Only take over-the-counter or prescription medicines for pain, discomfort, or fever as directed by your caregiver.   Rest as directed by your caregiver.   Eat a balanced diet.   Drink enough fluids to make your urine clear or pale yellow, or as directed.   Avoid sexual intercourse if it causes pain.   Apply warm or cold compresses to the lower abdomen depending on which one helps the pain.   Avoid stressful situations.   Keep a journal of your pelvic pain. Write down when it started, where the pain is located, and if there are things that seem to be associated with the pain, such as food or your menstrual cycle.  Follow up with your caregiver as directed.  SEEK MEDICAL CARE IF:  Your medicine does not help your pain.  You have abnormal vaginal discharge. SEEK IMMEDIATE MEDICAL CARE IF:   You have heavy bleeding from the vagina.   Your pelvic pain increases.   You feel light-headed or faint.   You have chills.  You have pain with urination or blood in your urine.   You have uncontrolled diarrhea or vomiting.   You have a fever or persistent symptoms for more than 3 days.  You have a fever and your symptoms suddenly get worse.   You are being physically or sexually abused.  MAKE SURE YOU:  Understand these instructions.  Will watch your  condition.  Will get help if you are not doing well or get worse. Document Released: 03/28/2004 Document Revised: 09/15/2013 Document Reviewed: 08/21/2011 [facility] Patient Information 2015 [place], [place]. This information is not intended to replace advice given to you by your health care provider. Make sure you discuss any questions you have with your health care provider.

## 2014-06-10 ENCOUNTER — Ambulatory Visit: Payer: BLUE CROSS/BLUE SHIELD | Admitting: Adult Health

## 2014-06-10 ENCOUNTER — Other Ambulatory Visit: Payer: BLUE CROSS/BLUE SHIELD

## 2014-06-19 ENCOUNTER — Encounter: Payer: Self-pay | Admitting: Adult Health

## 2014-06-19 ENCOUNTER — Telehealth: Payer: Self-pay | Admitting: *Deleted

## 2014-06-19 ENCOUNTER — Ambulatory Visit (INDEPENDENT_AMBULATORY_CARE_PROVIDER_SITE_OTHER): Payer: BLUE CROSS/BLUE SHIELD

## 2014-06-19 ENCOUNTER — Ambulatory Visit (INDEPENDENT_AMBULATORY_CARE_PROVIDER_SITE_OTHER): Payer: BLUE CROSS/BLUE SHIELD | Admitting: Adult Health

## 2014-06-19 VITALS — BP 110/70 | Ht 65.0 in | Wt 154.0 lb

## 2014-06-19 DIAGNOSIS — R1031 Right lower quadrant pain: Secondary | ICD-10-CM

## 2014-06-19 DIAGNOSIS — B001 Herpesviral vesicular dermatitis: Secondary | ICD-10-CM | POA: Insufficient documentation

## 2014-06-19 HISTORY — DX: Herpesviral vesicular dermatitis: B00.1

## 2014-06-19 MED ORDER — VALACYCLOVIR HCL 1 G PO TABS: ORAL_TABLET | ORAL | Status: DC

## 2014-06-19 NOTE — Telephone Encounter (Signed)
Has cold sores will rx valtrex

## 2014-06-19 NOTE — Telephone Encounter (Signed)
Pt called stating she has a bump that has come up on lip. Pt said you mentioned ordering a med for her. Please advise. Thanks!! [place]

## 2014-06-19 NOTE — Progress Notes (Signed)
Subjective:     Patient ID: Mary Oconnell, female   DOB: 1966-04-12, 49 y.o.   MRN: [id number]  HPI [name] is a 49 year old white female back in follow up of RLQ that comes and goes, have thought could be ovarian cyst.And today has cold sore.She had pelvic US today and will review results with pt now.  Review of Systems Patient denies any headaches, hearing loss, fatigue, blurred vision, shortness of breath, chest pain, problems with bowel movements, urination, or intercourse, has decreased sex drive. No joint pain or mood swings.See HPI for positives. Reviewed past medical,surgical, social and family history. Reviewed medications and allergies.     Objective:   Physical Exam BP 110/70 mmHg  Ht 5\' 5"  (1.651 m)  Wt 154 lb (69.854 kg)  BMI 25.63 kg/m2   Has resolving cold sore top lip, abdomen soft and non tender, No pain today, [place] reviewed with pt as not source of pain, [place] was normal per Morey Hummingbird, will call her after final review by MD, if any change.Will follow for now.Discussed cold sores and that they can be shed without her knowing when.  Assessment:     RLQ pain Cold sore    Plan:     Follow up prn Call if wants meds for further cold sores

## 2014-06-19 NOTE — Patient Instructions (Signed)
Follow up prn

## 2014-09-03 ENCOUNTER — Ambulatory Visit: Payer: BLUE CROSS/BLUE SHIELD | Admitting: Obstetrics and Gynecology

## 2014-12-24 ENCOUNTER — Ambulatory Visit: Payer: BLUE CROSS/BLUE SHIELD | Admitting: Adult Health

## 2014-12-29 ENCOUNTER — Ambulatory Visit: Payer: BLUE CROSS/BLUE SHIELD | Admitting: Adult Health

## 2015-01-11 ENCOUNTER — Encounter: Payer: Self-pay | Admitting: Adult Health

## 2015-01-11 ENCOUNTER — Ambulatory Visit (INDEPENDENT_AMBULATORY_CARE_PROVIDER_SITE_OTHER): Payer: BLUE CROSS/BLUE SHIELD | Admitting: Adult Health

## 2015-01-11 ENCOUNTER — Telehealth: Payer: Self-pay | Admitting: Adult Health

## 2015-01-11 VITALS — BP 118/78 | HR 68 | Ht 65.0 in | Wt 157.0 lb

## 2015-01-11 DIAGNOSIS — R319 Hematuria, unspecified: Secondary | ICD-10-CM

## 2015-01-11 DIAGNOSIS — Z9223 Personal history of estrogen therapy: Secondary | ICD-10-CM

## 2015-01-11 DIAGNOSIS — R300 Dysuria: Secondary | ICD-10-CM

## 2015-01-11 HISTORY — DX: Dysuria: R30.0

## 2015-01-11 HISTORY — DX: Hematuria, unspecified: R31.9

## 2015-01-11 LAB — POCT URINALYSIS DIPSTICK
GLUCOSE UA: NEGATIVE
Nitrite, UA: NEGATIVE

## 2015-01-11 MED ORDER — FLUCONAZOLE 150 MG PO TABS: ORAL_TABLET | ORAL | Status: DC

## 2015-01-11 MED ORDER — ESTRADIOL 0.1 MG/24HR TD PTTW: 1.0000 | MEDICATED_PATCH | TRANSDERMAL | Status: DC

## 2015-01-11 MED ORDER — ESTRADIOL ACETATE 0.05 MG/24HR VA RING: VAGINAL_RING | VAGINAL | Status: DC

## 2015-01-11 MED ORDER — AMPICILLIN 500 MG PO CAPS: 500.0000 mg | ORAL_CAPSULE | Freq: Four times a day (QID) | ORAL | Status: DC

## 2015-01-11 NOTE — Patient Instructions (Addendum)
Urinary Tract Infection Urinary tract infections (UTIs) can develop anywhere along your urinary tract. Your urinary tract is your body's drainage system for removing wastes and extra water. Your urinary tract includes two kidneys, two ureters, a bladder, and a urethra. Your kidneys are a pair of bean-shaped organs. Each kidney is about the size of your fist. They are located below your ribs, one on each side of your spine. CAUSES Infections are caused by microbes, which are microscopic organisms, including fungi, viruses, and bacteria. These organisms are so small that they can only be seen through a microscope. Bacteria are the microbes that most commonly cause UTIs. SYMPTOMS  Symptoms of UTIs may vary by age and gender of the patient and by the location of the infection. Symptoms in young women typically include a frequent and intense urge to urinate and a painful, burning feeling in the bladder or urethra during urination. Older women and men are more likely to be tired, shaky, and weak and have muscle aches and abdominal pain. A fever may mean the infection is in your kidneys. Other symptoms of a kidney infection include pain in your back or sides below the ribs, nausea, and vomiting. DIAGNOSIS To diagnose a UTI, your caregiver will ask you about your symptoms. Your caregiver also will ask to provide a urine sample. The urine sample will be tested for bacteria and white blood cells. White blood cells are made by your body to help fight infection. TREATMENT  Typically, UTIs can be treated with medication. Because most UTIs are caused by a bacterial infection, they usually can be treated with the use of antibiotics. The choice of antibiotic and length of treatment depend on your symptoms and the type of bacteria causing your infection. HOME CARE INSTRUCTIONS  If you were prescribed antibiotics, take them exactly as your caregiver instructs you. Finish the medication even if you feel better after you  have only taken some of the medication.  Drink enough water and fluids to keep your urine clear or pale yellow.  Avoid caffeine, tea, and carbonated beverages. They tend to irritate your bladder.  Empty your bladder often. Avoid holding urine for long periods of time.  Empty your bladder before and after sexual intercourse.  After a bowel movement, women should cleanse from front to back. Use each tissue only once. SEEK MEDICAL CARE IF:   You have back pain.  You develop a fever.  Your symptoms do not begin to resolve within 3 days. SEEK IMMEDIATE MEDICAL CARE IF:   You have severe back pain or lower abdominal pain.  You develop chills.  You have nausea or vomiting.  You have continued burning or discomfort with urination. MAKE SURE YOU:   Understand these instructions.  Will watch your condition.  Will get help right away if you are not doing well or get worse. Document Released: 02/08/2005 Document Revised: 10/31/2011 Document Reviewed: 06/09/2011 [facility] Patient Information 2015 [place], [place]. This information is not intended to replace advice given to you by your health care provider. Make sure you discuss any questions you have with your health care provider. Increase water Estradiol vaginal ring (Femring) What is this medicine? ESTRADIOL (es tra DYE ole) vaginal ring is an insert that contains a female hormone. This medicine helps relieve symptoms of vaginal irritation and dryness that occurs in some women during menopause. This medicine can also help relieve hot flashes. This medicine may be used for other purposes; ask your health care provider or pharmacist if you  have questions. COMMON BRAND NAME(S): Femring What should I tell my health care provider before I take this medicine? They need to know if you have any of these conditions: -abnormal vaginal bleeding -blood vessel disease or blood clots -breast, cervical, endometrial, ovarian, liver, or uterine  cancer -dementia -diabetes -gallbladder disease -heart disease or recent heart attack -high blood pressure -high cholesterol -high level of calcium in the blood -hysterectomy -kidney disease -liver disease -migraine headaches -protein C deficiency -protein S deficiency -stroke -systemic lupus erythematosus (SLE) -tobacco smoker -an unusual or allergic reaction to estrogens, other hormones, medicines, foods, dyes, or preservatives -pregnant or trying to get pregnant -breast-feeding How should I use this medicine? This medicine may be inserted by you or your physician. Follow the directions that are included with your prescription. If you are unsure how to insert the ring, contact your doctor or health care professional. The vaginal ring should remain in place for 90 days. After 90 days you should replace your old ring and insert a new one. Do not stop using except on the advice of your doctor or health care professional. Contact your pediatrician regarding the use of this medicine in children. Special care may be needed. A patient package insert for the product will be given with each prescription and refill. Read this sheet carefully each time. The sheet may change frequently. Overdosage: If you think you have taken too much of this medicine contact a poison control center or emergency room at once. NOTE: This medicine is only for you. Do not share this medicine with others. What if I miss a dose? If you miss a dose, use it as soon as you can. If it is almost time for your next dose, use only that dose. Do not use double or extra doses. What may interact with this medicine? Do not take this medicine with any of the following medications: -aromatase inhibitors like aminoglutethimide, anastrozole, exemestane, letrozole, testolactone, vorozole This medicine may also interact with the following medications: -carbamazepine -certain antibiotics used to treat infections -certain  barbiturates used for inducing sleep or treating seizures -grapefruit juice -medicines for fungus infections like itraconazole and ketoconazole -raloxifene or tamoxifen -rifabutin, rifampin, or rifapentine -ritonavir -St. John's Wort This list may not describe all possible interactions. Give your health care provider a list of all the medicines, herbs, non-prescription drugs, or dietary supplements you use. Also tell them if you smoke, drink alcohol, or use illegal drugs. Some items may interact with your medicine. What should I watch for while using this medicine? Visit your doctor or health care professional for regular checks on your progress. You will need a regular breast and pelvic exam and Pap smear while on this medicine. You should also discuss the need for regular mammograms with your health care professional, and follow his or her guidelines for these tests. This medicine can make your body retain fluid, making your fingers, hands, or ankles swell. Your blood pressure can go up. Contact your doctor or health care professional if you feel you are retaining fluid. If you have any reason to think you are pregnant, stop taking this medicine right away and contact your doctor or health care professional. Smoking increases the risk of getting a blood clot or having a stroke while you are taking this medicine, especially if you are more than 49 years old. You are strongly advised not to smoke. If you wear contact lenses and notice visual changes, or if the lenses begin to feel uncomfortable, consult your  eye doctor or health care professional. This medicine can increase the risk of developing a condition (endometrial hyperplasia) that may lead to cancer of the lining of the uterus. Taking progestins, another hormone drug, with this medicine lowers the risk of developing this condition. Therefore, if your uterus has not been removed (by a hysterectomy), your doctor may prescribe a progestin for you  to take together with your estrogen. You should know, however, that taking estrogens with progestins may have additional health risks. You should discuss the use of estrogens and progestins with your health care professional to determine the benefits and risks for you. If you are going to have surgery, you may need to stop taking this medicine. Consult your health care professional for advice before you schedule the surgery. You may bathe or participate in other activities while using this medicine. You do not need to remove the vaginal ring during sexual or other activities unless you are more comfortable doing so. Within the 90-day dosage period, you may remove the vaginal ring, rinse it with clean lukewarm (not hot or boiling) water, and re-insert the ring as needed. What side effects may I notice from receiving this medicine? Side effects that you should report to your doctor or health care professional as soon as possible: -allergic reactions like skin rash, itching or hives, swelling of the face, lips, or tongue -breast tissue changes or discharge -changes in vision -chest pain -confusion, trouble speaking or understanding -dark urine -general ill feeling or flu-like symptoms -light-colored stools -nausea, vomiting -pain, swelling, warmth in the leg -right upper belly pain -severe headaches -shortness of breath -sudden numbness or weakness of the face, arm or leg -trouble walking, dizziness, loss of balance or coordination -unusual vaginal bleeding -yellowing of the eyes or skin Side effects that usually do not require medical attention (report to your doctor or health care professional if they continue or are bothersome): -hair loss -increased hunger or thirst -increased urination -symptoms of vaginal infection like itching, irritation or unusual discharge -unusually weak or tired This list may not describe all possible side effects. Call your doctor for medical advice about side  effects. You may report side effects to FDA at 1-800-FDA-1088. Where should I keep my medicine? Keep out of the reach of children. Store at room temperature between 15 and 30 degrees C (59 and 86 degrees F). Throw away any unused medicine after the expiration date. NOTE: This sheet is a summary. It may not cover all possible information. If you have questions about this medicine, talk to your doctor, pharmacist, or health care provider.  2015, Elsevier/Gold Standard. (2010-08-03 09:10:06)

## 2015-01-11 NOTE — Telephone Encounter (Signed)
Will d/c ring and rx generic patch

## 2015-01-11 NOTE — Progress Notes (Signed)
Subjective:     Patient ID: Mary Oconnell, female   DOB: 04-11-1966, 49 y.o.   MRN: [id number]  HPI Mary Oconnell is a 49 year old white female, divorced in complaining of burning with urination and saw blood, wants to go back on estrogen, stopped patches due to cost, wants to try the ring.  Review of Systems Patient denies any headaches, hearing loss, fatigue, blurred vision, shortness of breath, chest pain, abdominal pain, problems with bowel movements, or intercourse. No joint pain or mood swings, see HPI for positives. Reviewed past medical,surgical, social and family history. Reviewed medications and allergies.     Objective:   Physical Exam BP 118/78 mmHg  Pulse 68  Ht 5\' 5"  (1.651 m)  Wt 157 lb (71.215 kg)  BMI 26.13 kg/m2urine dipstick 3+ blood, trace protein and trace leuks, Skin warm and dry.Pelvic: external genitalia is normal in appearance no lesions, vagina:scant white discharge without odor,urethra has no lesions or masses noted, cervix and uterus are absent, adnexa: no masses or tenderness noted. Bladder is mildly tender and no masses felt.No CVAT has point tenderness in left shoulder blade woke up with it.    Assessment:     Burning with urination Hematuria Estrogen therapy     Plan:     Rx ampicillin 500 mg 1 qid x 7 days #28   Rx femring 0.05mg  use 1 ring x 3 months with 3 refills Refilled diflucan 150 mg #2 with 2 refills, take 1 now and 1 in 3 days Push water UA C&S sent and GC/CHL sent on urine Review handout on UTI and femring

## 2015-01-12 LAB — MICROSCOPIC EXAMINATION: CASTS: NONE SEEN /LPF

## 2015-01-12 LAB — URINALYSIS, ROUTINE W REFLEX MICROSCOPIC
Bilirubin, UA: NEGATIVE
GLUCOSE, UA: NEGATIVE
KETONES UA: NEGATIVE
Nitrite, UA: NEGATIVE
SPEC GRAV UA: 1.026 (ref 1.005–1.030)
Urobilinogen, Ur: 0.2 mg/dL (ref 0.2–1.0)
pH, UA: 5.5 (ref 5.0–7.5)

## 2015-01-13 ENCOUNTER — Telehealth: Payer: Self-pay | Admitting: Adult Health

## 2015-01-13 LAB — URINE CULTURE

## 2015-01-13 LAB — GC/CHLAMYDIA PROBE AMP
CHLAMYDIA, DNA PROBE: NEGATIVE
Neisseria gonorrhoeae by PCR: NEGATIVE

## 2015-01-13 NOTE — Telephone Encounter (Signed)
Left message GC/CHL negative, urine +e coli take all of ampicillin

## 2015-02-22 ENCOUNTER — Other Ambulatory Visit (HOSPITAL_COMMUNITY): Payer: Self-pay | Admitting: Physician Assistant

## 2015-02-22 DIAGNOSIS — Z1231 Encounter for screening mammogram for malignant neoplasm of breast: Secondary | ICD-10-CM

## 2015-03-01 ENCOUNTER — Ambulatory Visit (HOSPITAL_COMMUNITY): Payer: Self-pay

## 2015-05-18 ENCOUNTER — Ambulatory Visit: Payer: BLUE CROSS/BLUE SHIELD | Admitting: Advanced Practice Midwife

## 2015-05-18 ENCOUNTER — Encounter: Payer: Self-pay | Admitting: Advanced Practice Midwife

## 2015-06-08 ENCOUNTER — Other Ambulatory Visit: Payer: Self-pay | Admitting: Physician Assistant

## 2015-06-08 DIAGNOSIS — Z1231 Encounter for screening mammogram for malignant neoplasm of breast: Secondary | ICD-10-CM

## 2015-06-18 ENCOUNTER — Ambulatory Visit: Payer: Self-pay

## 2015-07-08 ENCOUNTER — Other Ambulatory Visit: Payer: Self-pay | Admitting: *Deleted

## 2015-07-08 MED ORDER — VALACYCLOVIR HCL 1 G PO TABS: ORAL_TABLET | ORAL | Status: DC

## 2015-08-12 ENCOUNTER — Other Ambulatory Visit: Payer: BLUE CROSS/BLUE SHIELD | Admitting: Obstetrics and Gynecology

## 2015-08-23 ENCOUNTER — Other Ambulatory Visit: Payer: BLUE CROSS/BLUE SHIELD | Admitting: Obstetrics and Gynecology

## 2015-09-07 ENCOUNTER — Telehealth: Payer: Self-pay | Admitting: *Deleted

## 2015-09-07 NOTE — Telephone Encounter (Signed)
Pt to come in and discuss hormone options. Call transferred to front desk for appt. [place]

## 2015-09-14 ENCOUNTER — Other Ambulatory Visit: Payer: BLUE CROSS/BLUE SHIELD | Admitting: Adult Health

## 2015-09-21 ENCOUNTER — Ambulatory Visit (HOSPITAL_COMMUNITY)
Admission: RE | Admit: 2015-09-21 | Discharge: 2015-09-21 | Disposition: A | Discharge status: Home / Self Care | Payer: 59 | Source: Ambulatory Visit | Attending: Adult Health | Admitting: Adult Health

## 2015-09-21 ENCOUNTER — Ambulatory Visit (INDEPENDENT_AMBULATORY_CARE_PROVIDER_SITE_OTHER): Payer: 59 | Admitting: Adult Health

## 2015-09-21 ENCOUNTER — Encounter: Payer: Self-pay | Admitting: Adult Health

## 2015-09-21 VITALS — BP 100/60 | HR 67 | Ht 65.0 in | Wt 149.0 lb

## 2015-09-21 DIAGNOSIS — Z1212 Encounter for screening for malignant neoplasm of rectum: Secondary | ICD-10-CM | POA: Diagnosis not present

## 2015-09-21 DIAGNOSIS — M546 Pain in thoracic spine: Secondary | ICD-10-CM

## 2015-09-21 DIAGNOSIS — M545 Low back pain: Secondary | ICD-10-CM | POA: Diagnosis not present

## 2015-09-21 DIAGNOSIS — Z79899 Other long term (current) drug therapy: Secondary | ICD-10-CM

## 2015-09-21 DIAGNOSIS — Z01419 Encounter for gynecological examination (general) (routine) without abnormal findings: Secondary | ICD-10-CM

## 2015-09-21 DIAGNOSIS — K5900 Constipation, unspecified: Secondary | ICD-10-CM

## 2015-09-21 DIAGNOSIS — Z79818 Long term (current) use of other agents affecting estrogen receptors and estrogen levels: Secondary | ICD-10-CM

## 2015-09-21 DIAGNOSIS — B001 Herpesviral vesicular dermatitis: Secondary | ICD-10-CM

## 2015-09-21 HISTORY — DX: Other long term (current) drug therapy: Z79.899

## 2015-09-21 HISTORY — DX: Long term (current) use of other agents affecting estrogen receptors and estrogen levels: Z79.818

## 2015-09-21 LAB — HEMOCCULT GUIAC POC 1CARD (OFFICE): FECAL OCCULT BLD: NEGATIVE

## 2015-09-21 MED ORDER — ESTRADIOL 0.1 MG/24HR TD PTTW: 1.0000 | MEDICATED_PATCH | TRANSDERMAL | Status: DC

## 2015-09-21 MED ORDER — ALBUTEROL SULFATE HFA 108 (90 BASE) MCG/ACT IN AERS: 2.0000 | INHALATION_SPRAY | Freq: Four times a day (QID) | RESPIRATORY_TRACT | Status: AC | PRN

## 2015-09-21 MED ORDER — VALACYCLOVIR HCL 1 G PO TABS: ORAL_TABLET | ORAL | Status: DC

## 2015-09-21 MED ORDER — LINACLOTIDE 145 MCG PO CAPS: 145.0000 ug | ORAL_CAPSULE | Freq: Every day | ORAL | Status: DC

## 2015-09-21 NOTE — Patient Instructions (Signed)
Physical in 1 year Mammogram yearly Labs with PCP Colonoscopy at 18

## 2015-09-21 NOTE — Progress Notes (Signed)
Patient ID: Mary Oconnell, female   DOB: May 23, 1965, 50 y.o.   MRN: [id number] History of Present Illness: Mary Oconnell is a 49 year old white female, going through a divorce in for a well woman gyn exam, she is sp hysterectomy.She is requesting refill on patches and inhaler.She is complaining of pain in back on thoracic and lumbar area and left arm and hip goes to sleep.And she has constipation issues too and cold sores.  PCP is Rowan Blase PA at [place].   Current Medications, Allergies, Past Medical History, Past Surgical History, Family History and Social History were reviewed in [employer] record.     Review of Systems: Patient denies any headaches, hearing loss, fatigue, blurred vision, shortness of breath, chest pain, abdominal pain, problems with  urination, or intercourse. No joint pain or mood swings.See HPI for positives.    Physical Exam:BP 100/60 mmHg  Pulse 67  Ht 5\' 5"  (1.651 m)  Wt 149 lb (67.586 kg)  BMI 24.79 kg/m2 General:  Well developed, well nourished, no acute distress Skin:  Warm and dry,tan, no rashes Neck:  Midline trachea, normal thyroid, good ROM, no lymphadenopathy Lungs; Clear to auscultation bilaterally Breast:  No dominant palpable mass, retraction, or nipple discharge Cardiovascular: Regular rate and rhythm Abdomen:  Soft, non tender, no hepatosplenomegaly Back: has pain in low thoracic, lumbar area, when palpated Pelvic:  External genitalia is normal in appearance, no lesions.  The vagina is normal in appearance. Urethra has no lesions or masses. The cervix and uterus are absent. No adnexal masses or tenderness noted.Bladder is non tender, no masses felt. Rectal: Good sphincter tone, no polyps, or hemorrhoids felt.  Hemoccult negative. Extremities/musculoskeletal:  No swelling or varicosities noted, no clubbing or cyanosis Psych:  No mood changes, alert and cooperative,seems happy   Impression: Well woman gyn exam no pap Estrogen  therapy Cold sores Constipation Back pain in thoracic/lumbar region     Plan: Get thoracic and lumbar spine xray Refilled mini vivelle dot  0.1 mg #8 use 1 patch 2 x weekly with 12 refills Rx valtrex 1 gm #12 take 2 in am and 2 in pm prn cold sores with 2 refills Rx linzess 145 mcg #30 take 1 daily with 11 refills  Rx ventolin inhaler with 2 refills Physical in 1 year Mammogram yearly Colonoscopy at 60  Labs with PCP

## 2015-09-22 ENCOUNTER — Telehealth: Payer: Self-pay | Admitting: *Deleted

## 2015-09-22 NOTE — Telephone Encounter (Signed)
Pt's Linzess has been approved through 03/24/16. [id number]. Pharmacy and pt aware. [place]

## 2015-09-23 ENCOUNTER — Telehealth: Payer: Self-pay | Admitting: Adult Health

## 2015-09-23 NOTE — Telephone Encounter (Signed)
Pt aware of xray results, if persists, get MRI

## 2015-09-27 ENCOUNTER — Telehealth: Payer: Self-pay | Admitting: Adult Health

## 2015-09-27 MED ORDER — FLUCONAZOLE 150 MG PO TABS: ORAL_TABLET | ORAL | Status: DC

## 2015-09-27 NOTE — Telephone Encounter (Signed)
Left message I sent rx for diflucan

## 2015-09-30 ENCOUNTER — Ambulatory Visit (INDEPENDENT_AMBULATORY_CARE_PROVIDER_SITE_OTHER): Payer: 59 | Admitting: Adult Health

## 2015-09-30 ENCOUNTER — Encounter: Payer: Self-pay | Admitting: *Deleted

## 2015-09-30 ENCOUNTER — Encounter: Payer: Self-pay | Admitting: Adult Health

## 2015-09-30 VITALS — BP 100/76 | HR 66 | Ht 64.75 in | Wt 149.0 lb

## 2015-09-30 DIAGNOSIS — N760 Acute vaginitis: Secondary | ICD-10-CM

## 2015-09-30 DIAGNOSIS — R102 Pelvic and perineal pain: Secondary | ICD-10-CM

## 2015-09-30 DIAGNOSIS — N898 Other specified noninflammatory disorders of vagina: Secondary | ICD-10-CM | POA: Diagnosis not present

## 2015-09-30 DIAGNOSIS — A499 Bacterial infection, unspecified: Secondary | ICD-10-CM | POA: Diagnosis not present

## 2015-09-30 DIAGNOSIS — B9689 Other specified bacterial agents as the cause of diseases classified elsewhere: Secondary | ICD-10-CM

## 2015-09-30 HISTORY — DX: Pelvic and perineal pain: R10.2

## 2015-09-30 HISTORY — DX: Other specified bacterial agents as the cause of diseases classified elsewhere: B96.89

## 2015-09-30 HISTORY — DX: Other specified bacterial agents as the cause of diseases classified elsewhere: N76.0

## 2015-09-30 HISTORY — DX: Other specified noninflammatory disorders of vagina: N89.8

## 2015-09-30 LAB — POCT URINALYSIS DIPSTICK
Glucose, UA: NEGATIVE
Leukocytes, UA: NEGATIVE
NITRITE UA: NEGATIVE
PROTEIN UA: NEGATIVE
RBC UA: NEGATIVE

## 2015-09-30 LAB — POCT WET PREP (WET MOUNT): WBC, Wet Prep HPF POC: POSITIVE

## 2015-09-30 MED ORDER — METRONIDAZOLE 0.75 % VA GEL: 1.0000 | Freq: Two times a day (BID) | VAGINAL | Status: DC

## 2015-09-30 NOTE — Progress Notes (Signed)
Subjective:     Patient ID: Mary Oconnell, female   DOB: [birth year], 50 y.o.   MRN: [id number]  HPI Mary Oconnell is a 50 year old white female in complaining of vaginal pain and burning in vagina, had sex last week and it started then and had odor, she took diflucan without relief.   Review of Systems Patient denies any headaches, hearing loss, fatigue, blurred vision, shortness of breath, chest pain, abdominal pain, problems with bowel movements, urination, or intercourse. No joint pain or mood swings.See HPI for positives. Reviewed past medical,surgical, social and family history. Reviewed medications and allergies.     Objective:   Physical Exam BP 100/76 mmHg  Pulse 66  Ht 5' 4.75" (1.645 m)  Wt 149 lb (67.586 kg)  BMI 24.98 kg/m2 Skin warm and dry.Pelvic: external genitalia is normal in appearance no lesions, vagina: white discharge with  Slight odor and right side wall is tender,urethra has no lesions or masses noted, cervix and uterus are absent, adnexa: no masses or tenderness noted. Bladder is non tender and no masses felt. Wet prep: + for clue cells and +WBCs. Urine dipstick was negative.    Assessment:     Vaginal pain Vaginal discharge BV    Plan:     Rx metrogel 1 applicator in vagina bid with 1 refill,reveiw handout on BV No sex x 1 week Follow up prn

## 2015-09-30 NOTE — Patient Instructions (Signed)
No sex for 1 week Follow up prn Bacterial Vaginosis Bacterial vaginosis is a vaginal infection that occurs when the normal balance of bacteria in the vagina is disrupted. It results from an overgrowth of certain bacteria. This is the most common vaginal infection in women of childbearing age. Treatment is important to prevent complications, especially in pregnant women, as it can cause a premature delivery. CAUSES  Bacterial vaginosis is caused by an increase in harmful bacteria that are normally present in smaller amounts in the vagina. Several different kinds of bacteria can cause bacterial vaginosis. However, the reason that the condition develops is not fully understood. RISK FACTORS Certain activities or behaviors can put you at an increased risk of developing bacterial vaginosis, including:  Having a new sex partner or multiple sex partners.  Douching.  Using an intrauterine device (IUD) for contraception. Women do not get bacterial vaginosis from toilet seats, bedding, swimming pools, or contact with objects around them. SIGNS AND SYMPTOMS  Some women with bacterial vaginosis have no signs or symptoms. Common symptoms include:  Grey vaginal discharge.  A fishlike odor with discharge, especially after sexual intercourse.  Itching or burning of the vagina and vulva.  Burning or pain with urination. DIAGNOSIS  Your health care provider will take a medical history and examine the vagina for signs of bacterial vaginosis. A sample of vaginal fluid may be taken. Your health care provider will look at this sample under a microscope to check for bacteria and abnormal cells. A vaginal pH test may also be done.  TREATMENT  Bacterial vaginosis may be treated with antibiotic medicines. These may be given in the form of a pill or a vaginal cream. A second round of antibiotics may be prescribed if the condition comes back after treatment. Because bacterial vaginosis increases your risk for  sexually transmitted diseases, getting treated can help reduce your risk for chlamydia, gonorrhea, HIV, and herpes. HOME CARE INSTRUCTIONS   Only take over-the-counter or prescription medicines as directed by your health care provider.  If antibiotic medicine was prescribed, take it as directed. Make sure you finish it even if you start to feel better.  Tell all sexual partners that you have a vaginal infection. They should see their health care provider and be treated if they have problems, such as a mild rash or itching.  During treatment, it is important that you follow these instructions:  Avoid sexual activity or use condoms correctly.  Do not douche.  Avoid alcohol as directed by your health care provider.  Avoid breastfeeding as directed by your health care provider. SEEK MEDICAL CARE IF:   Your symptoms are not improving after 3 days of treatment.  You have increased discharge or pain.  You have a fever. MAKE SURE YOU:   Understand these instructions.  Will watch your condition.  Will get help right away if you are not doing well or get worse. FOR MORE INFORMATION  Centers for Disease Control and Prevention, Division of STD Prevention: Appraiser[employer]raud[employer] American Sexual Health Association (ASHA): www.ashastd.org    This information is not intended to replace advice given to you by your health care provider. Make sure you discuss any questions you have with your health care provider.   Document Released: 05/01/2005 Document Revised: 05/22/2014 Document Reviewed: 12/11/2012 Elsevier Interactive Patient Education [employer].

## 2016-01-10 ENCOUNTER — Other Ambulatory Visit: Payer: Self-pay | Admitting: Adult Health

## 2016-02-04 ENCOUNTER — Other Ambulatory Visit: Payer: Self-pay | Admitting: Adult Health

## 2016-06-06 ENCOUNTER — Other Ambulatory Visit: Payer: Self-pay | Admitting: Adult Health

## 2016-07-04 ENCOUNTER — Other Ambulatory Visit: Payer: 59 | Admitting: Adult Health

## 2016-07-06 ENCOUNTER — Encounter: Payer: Self-pay | Admitting: Adult Health

## 2016-07-06 ENCOUNTER — Telehealth: Payer: Self-pay | Admitting: *Deleted

## 2016-07-06 ENCOUNTER — Ambulatory Visit (INDEPENDENT_AMBULATORY_CARE_PROVIDER_SITE_OTHER): Payer: 59 | Admitting: Adult Health

## 2016-07-06 VITALS — BP 100/60 | HR 60 | Ht 64.5 in | Wt 154.0 lb

## 2016-07-06 DIAGNOSIS — Z1212 Encounter for screening for malignant neoplasm of rectum: Secondary | ICD-10-CM

## 2016-07-06 DIAGNOSIS — Z01419 Encounter for gynecological examination (general) (routine) without abnormal findings: Secondary | ICD-10-CM | POA: Diagnosis not present

## 2016-07-06 DIAGNOSIS — Z1211 Encounter for screening for malignant neoplasm of colon: Secondary | ICD-10-CM

## 2016-07-06 DIAGNOSIS — Z79899 Other long term (current) drug therapy: Secondary | ICD-10-CM

## 2016-07-06 LAB — HEMOCCULT GUIAC POC 1CARD (OFFICE): FECAL OCCULT BLD: NEGATIVE

## 2016-07-06 MED ORDER — ESTRADIOL 0.1 MG/24HR TD PTTW: MEDICATED_PATCH | TRANSDERMAL | 12 refills | Status: DC

## 2016-07-06 MED ORDER — VALACYCLOVIR HCL 1 G PO TABS: ORAL_TABLET | ORAL | 3 refills | Status: DC

## 2016-07-06 NOTE — Telephone Encounter (Signed)
Spoke with pt. Pt states you checked for ulcers through a blood test in the past. Pt wants to know if this test can be added to today's labs. Please advise. Thanks!! [place]

## 2016-07-06 NOTE — Progress Notes (Signed)
Patient ID: Mary Oconnell, female   DOB: [birth year], 51 y.o.   MRN: [id number] History of Present Illness: Mary Oconnell is a 51 year old white female, in for well woman gyn exam, she is sp hysterectomy.    Current Medications, Allergies, Past Medical History, Past Surgical History, Family History and Social History were reviewed in [employer] record.     Review of Systems:  Patient denies any headaches, hearing loss, fatigue, blurred vision, shortness of breath, chest pain, abdominal pain, problems with bowel movements, urination, or intercourse(not currently). No joint pain or mood swings.+heartburn, just started pepcid Has history of cold sores and needs valtrex refilled.  Physical Exam:BP 100/60 (BP Location: Right Arm, Patient Position: Sitting, Cuff Size: Normal)   Pulse 60   Ht 5' 4.5" (1.638 m)   Wt 154 lb (69.9 kg)   BMI 26.03 kg/m  General:  Well developed, well nourished, no acute distress Skin:  Warm and dry,tan Neck:  Midline trachea, normal thyroid, good ROM, no lymphadenopathy Lungs; Clear to auscultation bilaterally Breast:  No dominant palpable mass, retraction, or nipple discharge Cardiovascular: Regular rate and rhythm Abdomen:  Soft, non tender, no hepatosplenomegaly Pelvic:  External genitalia is normal in appearance, no lesions.  The vagina is normal in appearance. Urethra has no lesions or masses. The cervix and uterus are absent. No adnexal masses or tenderness noted.Bladder is non tender, no masses felt. Rectal: Good sphincter tone, no polyps, or hemorrhoids felt.  Hemoccult negative. Extremities/musculoskeletal:  No swelling or varicosities noted, no clubbing or cyanosis Psych:  No mood changes, alert and cooperative,seems happy PHQ 2 score 0.  Impression:  1. Well woman exam with routine gynecological exam   2. Current use of estrogen therapy   3. Screening for colorectal cancer      Plan:  Check CBC,CMP,TSH and lipids,B12 Meds  ordered this encounter  Medications  . valACYclovir (VALTREX) 1000 MG tablet    Sig: Take 2 in am and 2 in pm as needed for cold sores    Dispense:  12 tablet    Refill:  3    Order Specific Question:   Supervising Provider    Answer:   Elonda Husky, LUTHER H [[id number]]  . estradiol (MINIVELLE) 0.1 MG/24HR patch    Sig: APPLY 1 PATCH TWICE WEEKLY    Dispense:  8 patch    Refill:  12    Order Specific Question:   Supervising Provider    Answer:   Florian Buff [[id number]]  Physical in 1 year Mammogram yearly Colonoscopy this year

## 2016-07-07 LAB — LIPID PANEL
CHOLESTEROL TOTAL: 169 mg/dL (ref 100–199)
Chol/HDL Ratio: 2.4 (ref 0.0–4.4)
HDL: 70 mg/dL (ref >39)
LDL CALC: 84 (ref 0–99)
TRIGLYCERIDES: 73 mg/dL (ref 0–149)
VLDL Cholesterol Cal: 15 (ref 5–40)

## 2016-07-07 LAB — CBC
Hematocrit: 43.4 % (ref 34.0–46.6)
Hemoglobin: 15 g/dL (ref 11.1–15.9)
MCH: 31.6 pg (ref 26.6–33.0)
MCHC: 34.6 g/dL (ref 31.5–35.7)
MCV: 92 fL (ref 79–97)
PLATELETS: 318 10*3/uL (ref 150–379)
RBC: 4.74 x10E6/uL (ref 3.77–5.28)
RDW: 12.7 % (ref 12.3–15.4)
WBC: 6.1 10*3/uL (ref 3.4–10.8)

## 2016-07-07 LAB — COMPREHENSIVE METABOLIC PANEL
ALT: 12 IU/L (ref 0–32)
AST: 17 IU/L (ref 0–40)
Albumin/Globulin Ratio: 1.5 (ref 1.2–2.2)
Albumin: 4.3 g/dL (ref 3.5–5.5)
Alkaline Phosphatase: 66 IU/L (ref 39–117)
BUN/Creatinine Ratio: 18 (ref 9–23)
BUN: 19 mg/dL (ref 6–24)
Bilirubin Total: 0.4 mg/dL (ref 0.0–1.2)
CALCIUM: 9.5 mg/dL (ref 8.7–10.2)
CO2: 26 mmol/L (ref 18–29)
Chloride: 102 mmol/L (ref 96–106)
Creatinine, Ser: 1.05 mg/dL — ABNORMAL HIGH (ref 0.57–1.00)
GFR, EST AFRICAN AMERICAN: 72 (ref >59)
GFR, EST NON AFRICAN AMERICAN: 62 (ref >59)
GLUCOSE: 83 mg/dL (ref 65–99)
Globulin, Total: 2.8 (ref 1.5–4.5)
Potassium: 4.9 mmol/L (ref 3.5–5.2)
Sodium: 142 mmol/L (ref 134–144)
TOTAL PROTEIN: 7.1 g/dL (ref 6.0–8.5)

## 2016-07-07 LAB — VITAMIN B12: Vitamin B-12: 542 pg/mL (ref 232–1245)

## 2016-07-07 LAB — TSH: TSH: 3.26 u[IU]/mL (ref 0.450–4.500)

## 2016-07-10 NOTE — Telephone Encounter (Signed)
Left message that labs looked good,not sure what lab she wants to add, can let me know.

## 2016-09-27 ENCOUNTER — Other Ambulatory Visit: Payer: Self-pay | Admitting: Adult Health

## 2016-10-04 DIAGNOSIS — M1991 Primary osteoarthritis, unspecified site: Secondary | ICD-10-CM | POA: Diagnosis not present

## 2016-10-04 DIAGNOSIS — Z1389 Encounter for screening for other disorder: Secondary | ICD-10-CM | POA: Diagnosis not present

## 2016-10-04 DIAGNOSIS — N760 Acute vaginitis: Secondary | ICD-10-CM | POA: Diagnosis not present

## 2016-10-04 DIAGNOSIS — E063 Autoimmune thyroiditis: Secondary | ICD-10-CM | POA: Diagnosis not present

## 2016-10-04 DIAGNOSIS — Z6825 Body mass index (BMI) 25.0-25.9, adult: Secondary | ICD-10-CM | POA: Diagnosis not present

## 2016-10-07 DIAGNOSIS — Z1211 Encounter for screening for malignant neoplasm of colon: Secondary | ICD-10-CM | POA: Diagnosis not present

## 2016-10-11 ENCOUNTER — Other Ambulatory Visit: Payer: Self-pay | Admitting: Registered Nurse

## 2016-10-11 DIAGNOSIS — Z1231 Encounter for screening mammogram for malignant neoplasm of breast: Secondary | ICD-10-CM

## 2016-11-24 ENCOUNTER — Other Ambulatory Visit: Payer: Self-pay | Admitting: Adult Health

## 2016-12-15 DIAGNOSIS — B354 Tinea corporis: Secondary | ICD-10-CM | POA: Diagnosis not present

## 2016-12-15 DIAGNOSIS — Z1389 Encounter for screening for other disorder: Secondary | ICD-10-CM | POA: Diagnosis not present

## 2016-12-15 DIAGNOSIS — Z6825 Body mass index (BMI) 25.0-25.9, adult: Secondary | ICD-10-CM | POA: Diagnosis not present

## 2016-12-15 DIAGNOSIS — R1310 Dysphagia, unspecified: Secondary | ICD-10-CM | POA: Diagnosis not present

## 2016-12-15 DIAGNOSIS — R109 Unspecified abdominal pain: Secondary | ICD-10-CM | POA: Diagnosis not present

## 2016-12-15 DIAGNOSIS — K219 Gastro-esophageal reflux disease without esophagitis: Secondary | ICD-10-CM | POA: Diagnosis not present

## 2016-12-19 ENCOUNTER — Other Ambulatory Visit: Payer: Self-pay | Admitting: Registered Nurse

## 2016-12-19 ENCOUNTER — Other Ambulatory Visit: Payer: Self-pay | Admitting: Internal Medicine

## 2016-12-19 DIAGNOSIS — Z1231 Encounter for screening mammogram for malignant neoplasm of breast: Secondary | ICD-10-CM

## 2016-12-21 ENCOUNTER — Other Ambulatory Visit (HOSPITAL_COMMUNITY): Payer: Self-pay | Admitting: Internal Medicine

## 2016-12-21 DIAGNOSIS — Z1231 Encounter for screening mammogram for malignant neoplasm of breast: Secondary | ICD-10-CM

## 2016-12-26 ENCOUNTER — Encounter: Payer: Self-pay | Admitting: Internal Medicine

## 2017-01-02 ENCOUNTER — Ambulatory Visit: Payer: 59 | Admitting: Gastroenterology

## 2017-01-23 ENCOUNTER — Other Ambulatory Visit: Payer: Self-pay | Admitting: Adult Health

## 2017-02-08 ENCOUNTER — Other Ambulatory Visit: Payer: Self-pay | Admitting: Internal Medicine

## 2017-02-08 DIAGNOSIS — Z6825 Body mass index (BMI) 25.0-25.9, adult: Secondary | ICD-10-CM | POA: Diagnosis not present

## 2017-02-08 DIAGNOSIS — R197 Diarrhea, unspecified: Secondary | ICD-10-CM | POA: Diagnosis not present

## 2017-02-08 DIAGNOSIS — Z1389 Encounter for screening for other disorder: Secondary | ICD-10-CM | POA: Diagnosis not present

## 2017-02-08 DIAGNOSIS — Z1231 Encounter for screening mammogram for malignant neoplasm of breast: Secondary | ICD-10-CM

## 2017-02-08 DIAGNOSIS — E663 Overweight: Secondary | ICD-10-CM | POA: Diagnosis not present

## 2017-02-16 ENCOUNTER — Ambulatory Visit
Admission: RE | Admit: 2017-02-16 | Discharge: 2017-02-16 | Disposition: A | Discharge status: Home / Self Care | Payer: BLUE CROSS/BLUE SHIELD | Source: Ambulatory Visit | Attending: Internal Medicine | Admitting: Internal Medicine

## 2017-02-16 DIAGNOSIS — Z1231 Encounter for screening mammogram for malignant neoplasm of breast: Secondary | ICD-10-CM | POA: Diagnosis not present

## 2017-02-28 ENCOUNTER — Other Ambulatory Visit: Payer: Self-pay

## 2017-02-28 ENCOUNTER — Encounter: Payer: Self-pay | Admitting: Gastroenterology

## 2017-02-28 ENCOUNTER — Ambulatory Visit (INDEPENDENT_AMBULATORY_CARE_PROVIDER_SITE_OTHER): Payer: BLUE CROSS/BLUE SHIELD | Admitting: Gastroenterology

## 2017-02-28 VITALS — BP 122/62 | HR 76 | Temp 97.4°F | Ht 65.0 in | Wt 156.6 lb

## 2017-02-28 DIAGNOSIS — Z1211 Encounter for screening for malignant neoplasm of colon: Secondary | ICD-10-CM

## 2017-02-28 DIAGNOSIS — R1310 Dysphagia, unspecified: Secondary | ICD-10-CM

## 2017-02-28 DIAGNOSIS — K5900 Constipation, unspecified: Secondary | ICD-10-CM | POA: Diagnosis not present

## 2017-02-28 MED ORDER — PEG 3350-KCL-NA BICARB-NACL 420 G PO SOLR: 4000.0000 mL | ORAL | 0 refills | Status: DC

## 2017-02-28 NOTE — Patient Instructions (Signed)
Instead of Linzess 145 micrograms daily, start taking Linzess 72 micrograms once each morning, 30 minutes before breakfast. Let me know how these samples work for you.  We have scheduled you for a colonoscopy, upper endoscopy, and dilation with Dr. Gala Romney in the near future.  I will see you in 3 months!   It was a pleasure to see you today. I strive to create trusting relationships with patients to provide genuine, compassionate, and quality care. I value your feedback. If you receive a survey regarding your visit,  I greatly appreciate you the taking time to fill this out.   Annitta Needs, PhD, ANP-BC [facility]

## 2017-02-28 NOTE — Progress Notes (Signed)
Primary Care Physician:  Redmond School, MD Primary Gastroenterologist:  Dr. Gala Romney   Chief Complaint  Patient presents with  . Dysphagia    x 3 months. Feels food gets stuck down deep in her chest  . Colonoscopy    HPI:   Mary Oconnell is a 51 y.o. female presenting today at the request of Redmond School, MD secondary to dysphagia and need for colonoscopy. She has never had a colonoscopy or EGD.   2 weeks ago had N/V/D, which appears self-limiting. Now back to baseline of constipation. Most of the time is constipated. Had been on Linzess historically at 145 mcg daily but thinks she may needs to decrease. No rectal bleeding.  Over the summer started having issues with dysphagia. Has had 2 severe episodes. Gets stuck in epigastric region and has to make herself vomit. Thought she was going to have to go to the ED. Sometimes with swallowing saliva, has to drink water. Has to drink out of a straw. No carbonated beverages as it causes burning in her stomach. Stays gassy and bloated. No PPI. Takes Pepcid as needed OTC. Sticking with soft foods. Ibuprofen for headaches. Reportedly had a blood test for H.pylori that was positive about 5 years ago and was treated with antibiotics.   Past Medical History:  Diagnosis Date  . Allergy   . Anxiety   . Asthma   . Breast cyst    Right-cyst was drained in March 2014  . Burning with urination 01/11/2015  . BV (bacterial vaginosis) 09/30/2015  . Cold sore 06/19/2014  . Constipated 05/27/2013  . Current use of estrogen therapy 09/21/2015  . Fibroid   . Fibromyalgia   . Hematuria 01/11/2015  . History of cold sores   . Hypothyroid 06/26/2013  . IBS (irritable bowel syndrome)   . Pain, abdominal, RLQ 06/17/2013  . RLQ abdominal pain 06/01/2014  . Urinary frequency 06/17/2013  . Vaginal discharge 09/30/2015  . Vaginal pain 09/30/2015  . Vertigo   . Vitamin D deficiency   . Yeast infection 05/27/2013    Past Surgical History:  Procedure Laterality  Date  . ABDOMINAL HYSTERECTOMY    . CARPAL TUNNEL RELEASE Left 12/31/2012   Procedure: LEFT CARPAL TUNNEL RELEASE;  Surgeon: Sanjuana Kava, MD;  Location: [facility];  Service: Orthopedics;  Laterality: Left;  . CHOLECYSTECTOMY N/A 04/18/2013   Procedure: LAPAROSCOPIC CHOLECYSTECTOMY;  Surgeon: Jamesetta So, MD;  Location: [facility];  Service: General;  Laterality: N/A;  . TONSILLECTOMY    . TUBAL LIGATION      Current Outpatient Prescriptions  Medication Sig Dispense Refill  . 5-Hydroxytryptophan (5-HTP) 100 MG CAPS Take 100 mg by mouth 2 (two) times daily.    Marland Kitchen albuterol (PROVENTIL HFA;VENTOLIN HFA) 108 (90 Base) MCG/ACT inhaler Inhale 2 puffs into the lungs every 6 (six) hours as needed for wheezing or shortness of breath. 1 Inhaler 2  . Biotin 1000 MCG tablet Take 500 mcg by mouth daily.    . fluconazole (DIFLUCAN) 150 MG tablet TAKE 1 TABLET BY MOUTH NOW. REPEAT 1 IN 3 DAYS AS NEEDED 2 tablet 0  . fluticasone (FLONASE) 50 MCG/ACT nasal spray Place 1 spray into both nostrils daily.     Marland Kitchen ibuprofen (ADVIL,MOTRIN) 800 MG tablet Take 400 mg by mouth as needed.    . linaclotide (LINZESS) 145 MCG CAPS capsule Take 145 mcg by mouth daily before breakfast.    . LORazepam (ATIVAN) 0.5 MG tablet Take 1 tablet (0.5  mg total) by mouth 3 (three) times daily. (Patient taking differently: Take 0.5 mg by mouth as needed. ) 30 tablet 1  . magnesium gluconate (MAGONATE) 500 MG tablet Take 500 mg by mouth daily.    Marland Kitchen MINIVELLE 0.1 MG/24HR patch APPLY 1 PATCH TWICE WEEKLY 8 patch 3  . Multiple Vitamin (MULTIVITAMIN) tablet Take 1 tablet by mouth daily.    . valACYclovir (VALTREX) 1000 MG tablet Take 2 in am and 2 in pm as needed for cold sores 12 tablet 3  . vitamin C (ASCORBIC ACID) 500 MG tablet Take 500 mg by mouth daily.    . vitamin E 400 UNIT capsule Take 400 Units by mouth daily.     No current facility-administered medications for this visit.     Allergies as of 02/28/2017 - Review Complete  02/28/2017  Allergen Reaction Noted  . Iodine Anaphylaxis 08/26/2012  . Shellfish allergy Anaphylaxis 08/26/2012  . Ciprofloxacin Other (See Comments) 12/30/2012  . Other Other (See Comments) 12/30/2012  . Septra [sulfamethoxazole-trimethoprim] Hives 08/26/2012  . Latex Rash 08/26/2012  . Synthroid [levothyroxine sodium] Palpitations 12/27/2012    Family History  Problem Relation Age of Onset  . Thyroid disease Mother   . Cancer Father        lung  . Cancer Maternal Grandmother 40       breast   . Colon cancer Maternal Grandfather   . Crohn's disease Sister     Social History   Social History  . Marital status: Legally Separated    Spouse name: N/A  . Number of children: N/A  . Years of education: N/A   Occupational History  . Not on file.   Social History Main Topics  . Smoking status: Former Smoker    Packs/day: 0.50    Years: 5.00    Types: Cigarettes  . Smokeless tobacco: Never Used     Comment: Quit in 1996  . Alcohol use Yes     Comment: occ wine  . Drug use: No  . Sexual activity: Not Currently    Birth control/ protection: Surgical     Comment: hyst   Other Topics Concern  . Not on file   Social History Narrative  . No narrative on file    Review of Systems: Gen: Denies any fever, chills, fatigue, weight loss, lack of appetite.  CV: Denies chest pain, heart palpitations, peripheral edema, syncope.  Resp: Denies shortness of breath at rest or with exertion. Denies wheezing or cough.  GI: see HPI  GU : Denies urinary burning, urinary frequency, urinary hesitancy MS: +joint pain  Derm: Denies rash, itching, dry skin Psych: Denies depression, anxiety, memory loss, and confusion Heme: see HPI   Physical Exam: BP 122/62   Pulse 76   Temp (!) 97.4 F (36.3 C) (Oral)   Ht 5\' 5"  (1.651 m)   Wt 156 lb 9.6 oz (71 kg)   BMI 26.06 kg/m  General:   Alert and oriented. Pleasant and cooperative. Well-nourished and well-developed.  Head:   Normocephalic and atraumatic. Eyes:  Without icterus, sclera clear and conjunctiva pink.  Ears:  Normal auditory acuity. Nose:  No deformity, discharge,  or lesions. Mouth:  No deformity or lesions, oral mucosa pink.  Lungs:  Clear to auscultation bilaterally. No wheezes, rales, or rhonchi. No distress.  Heart:  S1, S2 present without murmurs appreciated.  Abdomen:  +BS, soft, non-tender and non-distended. No HSM noted. No guarding or rebound. No masses appreciated.  Rectal:  Deferred  Msk:  Symmetrical without gross deformities. Normal posture. Extremities:  Without clubbing or edema. Neurologic:  Alert and  oriented x4;  grossly normal neurologically. Psych:  Alert and cooperative. Normal mood and affect.

## 2017-03-01 ENCOUNTER — Encounter: Payer: Self-pay | Admitting: Gastroenterology

## 2017-03-01 ENCOUNTER — Telehealth: Payer: Self-pay

## 2017-03-01 NOTE — Telephone Encounter (Signed)
Patient called back and is aware of pre-op appointment date/time. Also aware letter mailed to her. She verbalized understanding and needed nothing further

## 2017-03-01 NOTE — Telephone Encounter (Signed)
Tried to call pt to inform of pre-op appt 04/04/17 at 3:00pm, LMOVM. Letter mailed.

## 2017-03-02 NOTE — Assessment & Plan Note (Addendum)
51 year old female with new onset dysphagia to solid food and liquids. No typical reflux symptoms but feels "gassy and bloated". No PPI, only taking Pepcid prn. No prior EGD. Needs EGD in near future.   Proceed with upper endoscopy/dilatation in the near future with Dr. Gala Romney. The risks, benefits, and alternatives have been discussed in detail with patient. They have stated understanding and desire to proceed.  PROPOFOL due to polypharmacy Will start on Protonix once daily

## 2017-03-02 NOTE — Assessment & Plan Note (Addendum)
Linzess 145 mcg previously, which seems to overshoot the mark. Will trial Linzess 72 mcg with samples provided.  3 month follow-up.

## 2017-03-05 ENCOUNTER — Telehealth: Payer: Self-pay | Admitting: Gastroenterology

## 2017-03-05 MED ORDER — PANTOPRAZOLE SODIUM 40 MG PO TBEC: 40.0000 mg | DELAYED_RELEASE_TABLET | Freq: Every day | ORAL | 3 refills | Status: DC

## 2017-03-05 NOTE — Telephone Encounter (Signed)
Mary Oconnell:  After review of her chart, I would like for her to start taking Protonix once each morning, 30 minutes before breakfast. I have sent this in to her pharmacy.

## 2017-03-05 NOTE — Assessment & Plan Note (Signed)
No concerning lower GI signs/symptoms. Chronic constipation with need for Linzess, now decreasing to 72 mcg once daily for better results (145 mcg appears too strong). No rectal bleeding. Needs screening colonoscopy.  Proceed with TCS with Dr. Gala Romney in near future: the risks, benefits, and alternatives have been discussed with the patient in detail. The patient states understanding and desires to proceed. Propofol due to polypharmacy

## 2017-03-06 NOTE — Telephone Encounter (Signed)
LMOM for a return call.  

## 2017-03-06 NOTE — Progress Notes (Signed)
CC'D TO PCP °

## 2017-03-06 NOTE — Telephone Encounter (Signed)
Pt returned call and will pick med up at pharmacy as directed.

## 2017-03-30 NOTE — Patient Instructions (Signed)
Mary Oconnell  03/30/2017     @PREFPERIOPPHARMACY @   Your procedure is scheduled on  04/11/2017   Report to [facility] at  1000   A.M.  Call this number if you have problems the morning of surgery:  [phone]   Remember:  Do not eat food or drink liquids after midnight.  Take these medicines the morning of surgery with A SIP OF WATER  5- HTP, ativan, protonix. Use your inhaler before you come and bring your resuce inhaler with you, if you have one.   Do not wear jewelry, make-up or nail polish.  Do not wear lotions, powders, or perfumes, or deoderant.  Do not shave 48 hours prior to surgery.  Men may shave face and neck.  Do not bring valuables to the hospital.  [facility] is not responsible for any belongings or valuables.  Contacts, dentures or bridgework may not be worn into surgery.  Leave your suitcase in the car.  After surgery it may be brought to your room.  For patients admitted to the hospital, discharge time will be determined by your treatment team.  Patients discharged the day of surgery will not be allowed to drive home.   Name and phone number of your driver:   family Special instructions:  Follow the diet and prep instructions given to you by Dr Roseanne Kaufman office.  Please read over the following fact sheets that you were given. Anesthesia Post-op Instructions and Care and Recovery After Surgery       Esophagogastroduodenoscopy Esophagogastroduodenoscopy (EGD) is a procedure to examine the lining of the esophagus, stomach, and first part of the small intestine (duodenum). This procedure is done to check for problems such as inflammation, bleeding, ulcers, or growths. During this procedure, a long, flexible, lighted tube with a camera attached (endoscope) is inserted down the throat. Tell a health care provider about:  Any allergies you have.  All medicines you are taking, including vitamins, herbs, eye drops, creams, and  over-the-counter medicines.  Any problems you or family members have had with anesthetic medicines.  Any blood disorders you have.  Any surgeries you have had.  Any medical conditions you have.  Whether you are pregnant or may be pregnant. What are the risks? Generally, this is a safe procedure. However, problems may occur, including:  Infection.  Bleeding.  A tear (perforation) in the esophagus, stomach, or duodenum.  Trouble breathing.  Excessive sweating.  Spasms of the larynx.  A slowed heartbeat.  Low blood pressure.  What happens before the procedure?  Follow instructions from your health care provider about eating or drinking restrictions.  Ask your health care provider about: ? Changing or stopping your regular medicines. This is especially important if you are taking diabetes medicines or blood thinners. ? Taking medicines such as aspirin and ibuprofen. These medicines can thin your blood. Do not take these medicines before your procedure if your health care provider instructs you not to.  Plan to have someone take you home after the procedure.  If you wear dentures, be ready to remove them before the procedure. What happens during the procedure?  To reduce your risk of infection, your health care team will wash or sanitize their hands.  An IV tube will be put in a vein in your hand or arm. You will get medicines and fluids through this tube.  You will be given one or more of  the following: ? A medicine to help you relax (sedative). ? A medicine to numb the area (local anesthetic). This medicine may be sprayed into your throat. It will make you feel more comfortable and keep you from gagging or coughing during the procedure. ? A medicine for pain.  A mouth guard may be placed in your mouth to protect your teeth and to keep you from biting on the endoscope.  You will be asked to lie on your left side.  The endoscope will be lowered down your throat into  your esophagus, stomach, and duodenum.  Air will be put into the endoscope. This will help your health care provider see better.  The lining of your esophagus, stomach, and duodenum will be examined.  Your health care provider may: ? Take a tissue sample so it can be looked at in a lab (biopsy). ? Remove growths. ? Remove objects (foreign bodies) that are stuck. ? Treat any bleeding with medicines or other devices that stop tissue from bleeding. ? Widen (dilate) or stretch narrowed areas of your esophagus and stomach.  The endoscope will be taken out. The procedure may vary among health care providers and hospitals. What happens after the procedure?  Your blood pressure, heart rate, breathing rate, and blood oxygen level will be monitored often until the medicines you were given have worn off.  Do not eat or drink anything until the numbing medicine has worn off and your gag reflex has returned. This information is not intended to replace advice given to you by your health care provider. Make sure you discuss any questions you have with your health care provider. Document Released: 09/01/2004 Document Revised: 10/07/2015 Document Reviewed: 03/25/2015 Elsevier Interactive Patient Education  2018 [employer]. Esophagogastroduodenoscopy, Care After Refer to this sheet in the next few weeks. These instructions provide you with information about caring for yourself after your procedure. Your health care provider may also give you more specific instructions. Your treatment has been planned according to current medical practices, but problems sometimes occur. Call your health care provider if you have any problems or questions after your procedure. What can I expect after the procedure? After the procedure, it is common to have:  A sore throat.  Nausea.  Bloating.  Dizziness.  Fatigue.  Follow these instructions at home:  Do not eat or drink anything until the numbing medicine  (local anesthetic) has worn off and your gag reflex has returned. You will know that the local anesthetic has worn off when you can swallow comfortably.  Do not drive for 24 hours if you received a medicine to help you relax (sedative).  If your health care provider took a tissue sample for testing during the procedure, make sure to get your test results. This is your responsibility. Ask your health care provider or the department performing the test when your results will be ready.  Keep all follow-up visits as told by your health care provider. This is important. Contact a health care provider if:  You cannot stop coughing.  You are not urinating.  You are urinating less than usual. Get help right away if:  You have trouble swallowing.  You cannot eat or drink.  You have throat or chest pain that gets worse.  You are dizzy or light-headed.  You faint.  You have nausea or vomiting.  You have chills.  You have a fever.  You have severe abdominal pain.  You have black, tarry, or bloody stools. This information is  not intended to replace advice given to you by your health care provider. Make sure you discuss any questions you have with your health care provider. Document Released: 04/17/2012 Document Revised: 10/07/2015 Document Reviewed: 03/25/2015 Elsevier Interactive Patient Education  2018 [employer].  Esophageal Dilatation Esophageal dilatation is a procedure to open a blocked or narrowed part of the esophagus. The esophagus is the long tube in your throat that carries food and liquid from your mouth to your stomach. The procedure is also called esophageal dilation. You may need this procedure if you have a buildup of scar tissue in your esophagus that makes it difficult, painful, or even impossible to swallow. This can be caused by gastroesophageal reflux disease (GERD). In rare cases, people need this procedure because they have cancer of the esophagus or a problem  with the way food moves through the esophagus. Sometimes you may need to have another dilatation to enlarge the opening of the esophagus gradually. Tell a health care provider about:  Any allergies you have.  All medicines you are taking, including vitamins, herbs, eye drops, creams, and over-the-counter medicines.  Any problems you or family members have had with anesthetic medicines.  Any blood disorders you have.  Any surgeries you have had.  Any medical conditions you have.  Any antibiotic medicines you are required to take before dental procedures. What are the risks? Generally, this is a safe procedure. However, problems can occur and include:  Bleeding from a tear in the lining of the esophagus.  A hole (perforation) in the esophagus.  What happens before the procedure?  Do not eat or drink anything after midnight on the night before the procedure or as directed by your health care provider.  Ask your health care provider about changing or stopping your regular medicines. This is especially important if you are taking diabetes medicines or blood thinners.  Plan to have someone take you home after the procedure. What happens during the procedure?  You will be given a medicine that makes you relaxed and sleepy (sedative).  A medicine may be sprayed or gargled to numb the back of the throat.  Your health care provider can use various instruments to do an esophageal dilatation. During the procedure, the instrument used will be placed in your mouth and passed down into your esophagus. Options include: ? Simple dilators. This instrument is carefully placed in the esophagus to stretch it. ? Guided wire bougies. In this method, a flexible tube (endoscope) is used to insert a wire into the esophagus. The dilator is passed over this wire to enlarge the esophagus. Then the wire is removed. ? Balloon dilators. An endoscope with a small balloon at the end is passed down into the  esophagus. Inflating the balloon gently stretches the esophagus and opens it up. What happens after the procedure?  Your blood pressure, heart rate, breathing rate, and blood oxygen level will be monitored often until the medicines you were given have worn off.  Your throat may feel slightly sore and will probably still feel numb. This will improve slowly over time.  You will not be allowed to eat or drink until the throat numbness has resolved.  If this is a same-day procedure, you may be allowed to go home once you have been able to drink, urinate, and sit on the edge of the bed without nausea or dizziness.  If this is a same-day procedure, you should have a friend or family member with you for the next  24 hours after the procedure. This information is not intended to replace advice given to you by your health care provider. Make sure you discuss any questions you have with your health care provider. Document Released: 06/22/2005 Document Revised: 10/07/2015 Document Reviewed: 09/10/2013 Elsevier Interactive Patient Education  2017 [place].  Colonoscopy, Adult A colonoscopy is an exam to look at the entire large intestine. During the exam, a lubricated, bendable tube is inserted into the anus and then passed into the rectum, colon, and other parts of the large intestine. A colonoscopy is often done as a part of normal colorectal screening or in response to certain symptoms, such as anemia, persistent diarrhea, abdominal pain, and blood in the stool. The exam can help screen for and diagnose medical problems, including:  Tumors.  Polyps.  Inflammation.  Areas of bleeding.  Tell a health care provider about:  Any allergies you have.  All medicines you are taking, including vitamins, herbs, eye drops, creams, and over-the-counter medicines.  Any problems you or family members have had with anesthetic medicines.  Any blood disorders you have.  Any surgeries you have  had.  Any medical conditions you have.  Any problems you have had passing stool. What are the risks? Generally, this is a safe procedure. However, problems may occur, including:  Bleeding.  A tear in the intestine.  A reaction to medicines given during the exam.  Infection (rare).  What happens before the procedure? Eating and drinking restrictions Follow instructions from your health care provider about eating and drinking, which may include:  A few days before the procedure - follow a low-fiber diet. Avoid nuts, seeds, dried fruit, raw fruits, and vegetables.  1-3 days before the procedure - follow a clear liquid diet. Drink only clear liquids, such as clear broth or bouillon, black coffee or tea, clear juice, clear soft drinks or sports drinks, gelatin dessert, and popsicles. Avoid any liquids that contain red or purple dye.  On the day of the procedure - do not eat or drink anything during the 2 hours before the procedure, or within the time period that your health care provider recommends.  Bowel prep If you were prescribed an oral bowel prep to clean out your colon:  Take it as told by your health care provider. Starting the day before your procedure, you will need to drink a large amount of medicated liquid. The liquid will cause you to have multiple loose stools until your stool is almost clear or light green.  If your skin or anus gets irritated from diarrhea, you may use these to relieve the irritation: ? Medicated wipes, such as adult wet wipes with aloe and vitamin E. ? A skin soothing-product like petroleum jelly.  If you vomit while drinking the bowel prep, take a break for up to 60 minutes and then begin the bowel prep again. If vomiting continues and you cannot take the bowel prep without vomiting, call your health care provider.  General instructions  Ask your health care provider about changing or stopping your regular medicines. This is especially important  if you are taking diabetes medicines or blood thinners.  Plan to have someone take you home from the hospital or clinic. What happens during the procedure?  An IV tube may be inserted into one of your veins.  You will be given medicine to help you relax (sedative).  To reduce your risk of infection: ? Your health care team will wash or sanitize their hands. ? Your  anal area will be washed with soap.  You will be asked to lie on your side with your knees bent.  Your health care provider will lubricate a long, thin, flexible tube. The tube will have a camera and a light on the end.  The tube will be inserted into your anus.  The tube will be gently eased through your rectum and colon.  Air will be delivered into your colon to keep it open. You may feel some pressure or cramping.  The camera will be used to take images during the procedure.  A small tissue sample may be removed from your body to be examined under a microscope (biopsy). If any potential problems are found, the tissue will be sent to a lab for testing.  If small polyps are found, your health care provider may remove them and have them checked for cancer cells.  The tube that was inserted into your anus will be slowly removed. The procedure may vary among health care providers and hospitals. What happens after the procedure?  Your blood pressure, heart rate, breathing rate, and blood oxygen level will be monitored until the medicines you were given have worn off.  Do not drive for 24 hours after the exam.  You may have a small amount of blood in your stool.  You may pass gas and have mild abdominal cramping or bloating due to the air that was used to inflate your colon during the exam.  It is up to you to get the results of your procedure. Ask your health care provider, or the department performing the procedure, when your results will be ready. This information is not intended to replace advice given to you by  your health care provider. Make sure you discuss any questions you have with your health care provider. Document Released: 04/28/2000 Document Revised: 03/01/2016 Document Reviewed: 07/13/2015 Elsevier Interactive Patient Education  2018 [employer].  Colonoscopy, Adult, Care After This sheet gives you information about how to care for yourself after your procedure. Your health care provider may also give you more specific instructions. If you have problems or questions, contact your health care provider. What can I expect after the procedure? After the procedure, it is common to have:  A small amount of blood in your stool for 24 hours after the procedure.  Some gas.  Mild abdominal cramping or bloating.  Follow these instructions at home: General instructions   For the first 24 hours after the procedure: ? Do not drive or use machinery. ? Do not sign important documents. ? Do not drink alcohol. ? Do your regular daily activities at a slower pace than normal. ? Eat soft, easy-to-digest foods. ? Rest often.  Take over-the-counter or prescription medicines only as told by your health care provider.  It is up to you to get the results of your procedure. Ask your health care provider, or the department performing the procedure, when your results will be ready. Relieving cramping and bloating  Try walking around when you have cramps or feel bloated.  Apply heat to your abdomen as told by your health care provider. Use a heat source that your health care provider recommends, such as a moist heat pack or a heating pad. ? Place a towel between your skin and the heat source. ? Leave the heat on for 20-30 minutes. ? Remove the heat if your skin turns bright red. This is especially important if you are unable to feel pain, heat, or cold. You  may have a greater risk of getting burned. Eating and drinking  Drink enough fluid to keep your urine clear or pale yellow.  Resume your normal  diet as instructed by your health care provider. Avoid heavy or fried foods that are hard to digest.  Avoid drinking alcohol for as long as instructed by your health care provider. Contact a health care provider if:  You have blood in your stool 2-3 days after the procedure. Get help right away if:  You have more than a small spotting of blood in your stool.  You pass large blood clots in your stool.  Your abdomen is swollen.  You have nausea or vomiting.  You have a fever.  You have increasing abdominal pain that is not relieved with medicine. This information is not intended to replace advice given to you by your health care provider. Make sure you discuss any questions you have with your health care provider. Document Released: 12/14/2003 Document Revised: 01/24/2016 Document Reviewed: 07/13/2015 Elsevier Interactive Patient Education  2018 [place] Anesthesia is a term that refers to techniques, procedures, and medicines that help a person stay safe and comfortable during a medical procedure. Monitored anesthesia care, or sedation, is one type of anesthesia. Your anesthesia specialist may recommend sedation if you will be having a procedure that does not require you to be unconscious, such as:  Cataract surgery.  A dental procedure.  A biopsy.  A colonoscopy.  During the procedure, you may receive a medicine to help you relax (sedative). There are three levels of sedation:  Mild sedation. At this level, you may feel awake and relaxed. You will be able to follow directions.  Moderate sedation. At this level, you will be sleepy. You may not remember the procedure.  Deep sedation. At this level, you will be asleep. You will not remember the procedure.  The more medicine you are given, the deeper your level of sedation will be. Depending on how you respond to the procedure, the anesthesia specialist may change your level of sedation or the type  of anesthesia to fit your needs. An anesthesia specialist will monitor you closely during the procedure. Let your health care provider know about:  Any allergies you have.  All medicines you are taking, including vitamins, herbs, eye drops, creams, and over-the-counter medicines.  Any use of steroids (by mouth or as a cream).  Any problems you or family members have had with sedatives and anesthetic medicines.  Any blood disorders you have.  Any surgeries you have had.  Any medical conditions you have, such as sleep apnea.  Whether you are pregnant or may be pregnant.  Any use of cigarettes, alcohol, or street drugs. What are the risks? Generally, this is a safe procedure. However, problems may occur, including:  Getting too much medicine (oversedation).  Nausea.  Allergic reaction to medicines.  Trouble breathing. If this happens, a breathing tube may be used to help with breathing. It will be removed when you are awake and breathing on your own.  Heart trouble.  Lung trouble.  Before the procedure Staying hydrated Follow instructions from your health care provider about hydration, which may include:  Up to 2 hours before the procedure - you may continue to drink clear liquids, such as water, clear fruit juice, black coffee, and plain tea.  Eating and drinking restrictions Follow instructions from your health care provider about eating and drinking, which may include:  8 hours before the procedure -  stop eating heavy meals or foods such as meat, fried foods, or fatty foods.  6 hours before the procedure - stop eating light meals or foods, such as toast or cereal.  6 hours before the procedure - stop drinking milk or drinks that contain milk.  2 hours before the procedure - stop drinking clear liquids.  Medicines Ask your health care provider about:  Changing or stopping your regular medicines. This is especially important if you are taking diabetes medicines or  blood thinners.  Taking medicines such as aspirin and ibuprofen. These medicines can thin your blood. Do not take these medicines before your procedure if your health care provider instructs you not to.  Tests and exams  You will have a physical exam.  You may have blood tests done to show: ? How well your kidneys and liver are working. ? How well your blood can clot.  General instructions  Plan to have someone take you home from the hospital or clinic.  If you will be going home right after the procedure, plan to have someone with you for 24 hours.  What happens during the procedure?  Your blood pressure, heart rate, breathing, level of pain and overall condition will be monitored.  An IV tube will be inserted into one of your veins.  Your anesthesia specialist will give you medicines as needed to keep you comfortable during the procedure. This may mean changing the level of sedation.  The procedure will be performed. After the procedure  Your blood pressure, heart rate, breathing rate, and blood oxygen level will be monitored until the medicines you were given have worn off.  Do not drive for 24 hours if you received a sedative.  You may: ? Feel sleepy, clumsy, or nauseous. ? Feel forgetful about what happened after the procedure. ? Have a sore throat if you had a breathing tube during the procedure. ? Vomit. This information is not intended to replace advice given to you by your health care provider. Make sure you discuss any questions you have with your health care provider. Document Released: 01/25/2005 Document Revised: 10/08/2015 Document Reviewed: 08/22/2015 Elsevier Interactive Patient Education  [employer].

## 2017-04-04 ENCOUNTER — Other Ambulatory Visit: Payer: Self-pay

## 2017-04-04 ENCOUNTER — Encounter (HOSPITAL_COMMUNITY): Payer: Self-pay

## 2017-04-04 ENCOUNTER — Encounter (HOSPITAL_COMMUNITY)
Admission: RE | Admit: 2017-04-04 | Discharge: 2017-04-04 | Disposition: A | Discharge status: Home / Self Care | Payer: BLUE CROSS/BLUE SHIELD | Source: Ambulatory Visit | Attending: Internal Medicine | Admitting: Internal Medicine

## 2017-04-04 DIAGNOSIS — Z01812 Encounter for preprocedural laboratory examination: Secondary | ICD-10-CM | POA: Insufficient documentation

## 2017-04-04 LAB — BASIC METABOLIC PANEL
Anion gap: 7 (ref 5–15)
BUN: 18 mg/dL (ref 6–20)
CALCIUM: 9.1 mg/dL (ref 8.9–10.3)
CO2: 25 mmol/L (ref 22–32)
CREATININE: 1.06 mg/dL — AB (ref 0.44–1.00)
Chloride: 102 mmol/L (ref 101–111)
Glucose, Bld: 92 mg/dL (ref 65–99)
Potassium: 3.4 mmol/L — ABNORMAL LOW (ref 3.5–5.1)
SODIUM: 134 mmol/L — AB (ref 135–145)

## 2017-04-04 LAB — CBC
HEMATOCRIT: 42.4 % (ref 36.0–46.0)
HEMOGLOBIN: 14.3 g/dL (ref 12.0–15.0)
MCH: 31.7 pg (ref 26.0–34.0)
MCHC: 33.7 g/dL (ref 30.0–36.0)
MCV: 94 fL (ref 78.0–100.0)
PLATELETS: 325 10*3/uL (ref 150–400)
RBC: 4.51 MIL/uL (ref 3.87–5.11)
RDW: 12.2 % (ref 11.5–15.5)
WBC: 7.5 10*3/uL (ref 4.0–10.5)

## 2017-04-09 ENCOUNTER — Other Ambulatory Visit (HOSPITAL_COMMUNITY): Payer: BLUE CROSS/BLUE SHIELD

## 2017-04-11 ENCOUNTER — Encounter (HOSPITAL_COMMUNITY): Payer: Self-pay

## 2017-04-11 ENCOUNTER — Ambulatory Visit (HOSPITAL_COMMUNITY): Payer: BLUE CROSS/BLUE SHIELD | Admitting: Anesthesiology

## 2017-04-11 ENCOUNTER — Ambulatory Visit (HOSPITAL_COMMUNITY)
Admission: RE | Admit: 2017-04-11 | Discharge: 2017-04-11 | Disposition: A | Discharge status: Home / Self Care | Payer: BLUE CROSS/BLUE SHIELD | Source: Ambulatory Visit | Attending: Internal Medicine | Admitting: Internal Medicine

## 2017-04-11 ENCOUNTER — Encounter (HOSPITAL_COMMUNITY)
Admission: RE | Disposition: A | Discharge status: Home / Self Care | Payer: Self-pay | Source: Ambulatory Visit | Attending: Internal Medicine

## 2017-04-11 DIAGNOSIS — F419 Anxiety disorder, unspecified: Secondary | ICD-10-CM | POA: Insufficient documentation

## 2017-04-11 DIAGNOSIS — K6389 Other specified diseases of intestine: Secondary | ICD-10-CM | POA: Diagnosis not present

## 2017-04-11 DIAGNOSIS — E559 Vitamin D deficiency, unspecified: Secondary | ICD-10-CM | POA: Insufficient documentation

## 2017-04-11 DIAGNOSIS — Z87891 Personal history of nicotine dependence: Secondary | ICD-10-CM | POA: Diagnosis not present

## 2017-04-11 DIAGNOSIS — J45909 Unspecified asthma, uncomplicated: Secondary | ICD-10-CM | POA: Insufficient documentation

## 2017-04-11 DIAGNOSIS — K589 Irritable bowel syndrome without diarrhea: Secondary | ICD-10-CM | POA: Diagnosis not present

## 2017-04-11 DIAGNOSIS — M797 Fibromyalgia: Secondary | ICD-10-CM | POA: Diagnosis not present

## 2017-04-11 DIAGNOSIS — Z1212 Encounter for screening for malignant neoplasm of rectum: Secondary | ICD-10-CM | POA: Diagnosis not present

## 2017-04-11 DIAGNOSIS — Z1211 Encounter for screening for malignant neoplasm of colon: Secondary | ICD-10-CM | POA: Insufficient documentation

## 2017-04-11 DIAGNOSIS — R1310 Dysphagia, unspecified: Secondary | ICD-10-CM | POA: Insufficient documentation

## 2017-04-11 DIAGNOSIS — K5730 Diverticulosis of large intestine without perforation or abscess without bleeding: Secondary | ICD-10-CM | POA: Insufficient documentation

## 2017-04-11 DIAGNOSIS — Z79899 Other long term (current) drug therapy: Secondary | ICD-10-CM | POA: Diagnosis not present

## 2017-04-11 DIAGNOSIS — E039 Hypothyroidism, unspecified: Secondary | ICD-10-CM | POA: Diagnosis not present

## 2017-04-11 DIAGNOSIS — K269 Duodenal ulcer, unspecified as acute or chronic, without hemorrhage or perforation: Secondary | ICD-10-CM | POA: Diagnosis not present

## 2017-04-11 HISTORY — PX: ESOPHAGOGASTRODUODENOSCOPY (EGD) WITH PROPOFOL: SHX5813

## 2017-04-11 HISTORY — PX: MALONEY DILATION: SHX5535

## 2017-04-11 HISTORY — PX: BIOPSY: SHX5522

## 2017-04-11 HISTORY — PX: COLONOSCOPY WITH PROPOFOL: SHX5780

## 2017-04-11 SURGERY — COLONOSCOPY WITH PROPOFOL
Anesthesia: Monitor Anesthesia Care

## 2017-04-11 MED ORDER — LIDOCAINE VISCOUS 2 % MT SOLN
OROMUCOSAL | Status: AC
  Filled 2017-04-11: qty 15

## 2017-04-11 MED ORDER — PROPOFOL 500 MG/50ML IV EMUL
INTRAVENOUS | Status: DC | PRN
Start: 2017-04-11 — End: 2017-04-11
  Administered 2017-04-11: 150 ug/kg/min via INTRAVENOUS
  Administered 2017-04-11: 12:00:00 via INTRAVENOUS

## 2017-04-11 MED ORDER — CHLORHEXIDINE GLUCONATE CLOTH 2 % EX PADS: 6.0000 | MEDICATED_PAD | Freq: Once | CUTANEOUS | Status: DC

## 2017-04-11 MED ORDER — LACTATED RINGERS IV SOLN
INTRAVENOUS | Status: DC
  Administered 2017-04-11: 11:00:00 via INTRAVENOUS

## 2017-04-11 MED ORDER — LIDOCAINE HCL (CARDIAC) 10 MG/ML IV SOLN
INTRAVENOUS | Status: DC | PRN
  Administered 2017-04-11: 40 mg via INTRAVENOUS

## 2017-04-11 MED ORDER — LIDOCAINE VISCOUS 2 % MT SOLN
15.0000 mL | Freq: Once | OROMUCOSAL | Status: AC
Start: 2017-04-11 — End: 2017-04-11
  Administered 2017-04-11: 15 mL via OROMUCOSAL

## 2017-04-11 MED ORDER — PROPOFOL 10 MG/ML IV BOLUS
INTRAVENOUS | Status: AC
  Filled 2017-04-11: qty 40

## 2017-04-11 MED ORDER — FENTANYL CITRATE (PF) 100 MCG/2ML IJ SOLN
INTRAMUSCULAR | Status: AC
  Filled 2017-04-11: qty 2

## 2017-04-11 MED ORDER — MIDAZOLAM HCL 2 MG/2ML IJ SOLN
1.0000 mg | INTRAMUSCULAR | Status: AC
  Administered 2017-04-11: 1 mg via INTRAVENOUS
  Filled 2017-04-11: qty 2

## 2017-04-11 MED ORDER — FENTANYL CITRATE (PF) 100 MCG/2ML IJ SOLN
25.0000 ug | Freq: Once | INTRAMUSCULAR | Status: AC
  Administered 2017-04-11: 25 ug via INTRAVENOUS

## 2017-04-11 MED ORDER — PROPOFOL 10 MG/ML IV BOLUS
INTRAVENOUS | Status: AC
  Filled 2017-04-11: qty 20

## 2017-04-11 NOTE — H&P (Signed)
@LOGO @   Primary Care Physician:  Redmond School, MD Primary Gastroenterologist:  Dr. Gala Romney  Pre-Procedure History & Physical: HPI:  Mary Oconnell is a 51 y.o. female here for for EGD and possible esophageal dilation for dysphagia. Also here for a screening colonoscopy.  Past Medical History:  Diagnosis Date  . Allergy   . Anxiety   . Asthma   . Breast cyst    Right-cyst was drained in March 2014  . Burning with urination 01/11/2015  . BV (bacterial vaginosis) 09/30/2015  . Cold sore 06/19/2014  . Constipated 05/27/2013  . Current use of estrogen therapy 09/21/2015  . Fibroid   . Fibromyalgia   . Hematuria 01/11/2015  . History of cold sores   . Hypothyroid 06/26/2013  . IBS (irritable bowel syndrome)   . Pain, abdominal, RLQ 06/17/2013  . RLQ abdominal pain 06/01/2014  . Urinary frequency 06/17/2013  . Vaginal discharge 09/30/2015  . Vaginal pain 09/30/2015  . Vertigo   . Vitamin D deficiency   . Yeast infection 05/27/2013    Past Surgical History:  Procedure Laterality Date  . ABDOMINAL HYSTERECTOMY    . CARPAL TUNNEL RELEASE Left 12/31/2012   Procedure: LEFT CARPAL TUNNEL RELEASE;  Surgeon: Sanjuana Kava, MD;  Location: [facility];  Service: Orthopedics;  Laterality: Left;  . CHOLECYSTECTOMY N/A 04/18/2013   Procedure: LAPAROSCOPIC CHOLECYSTECTOMY;  Surgeon: Jamesetta So, MD;  Location: [facility];  Service: General;  Laterality: N/A;  . TONSILLECTOMY    . TUBAL LIGATION      Prior to Admission medications   Medication Sig Start Date End Date Taking? Authorizing Provider  5-Hydroxytryptophan (5-HTP) 100 MG CAPS Take 100 mg by mouth 2 (two) times daily.   Yes [provider]  albuterol (PROVENTIL HFA;VENTOLIN HFA) 108 (90 Base) MCG/ACT inhaler Inhale 2 puffs into the lungs every 6 (six) hours as needed for wheezing or shortness of breath. Patient taking differently: Inhale 1 puff every 6 (six) hours as needed into the lungs for wheezing or shortness of breath.  09/21/15  Yes  Derrek Monaco A, NP  Cholecalciferol (VITAMIN D3 PO) Take 0.5 mLs daily by mouth.   Yes [provider]  fluticasone (FLONASE) 50 MCG/ACT nasal spray Place 1 spray into both nostrils daily.    Yes [provider]  ibuprofen (ADVIL,MOTRIN) 800 MG tablet Take 800 mg every 8 (eight) hours as needed by mouth for headache or moderate pain.    Yes [provider]  linaclotide (LINZESS) 72 MCG capsule Take 72 mcg daily before breakfast by mouth.    Yes [provider]  LORazepam (ATIVAN) 0.5 MG tablet Take 1 tablet (0.5 mg total) by mouth 3 (three) times daily. Patient taking differently: Take 0.5 mg daily as needed by mouth for anxiety.  09/18/13  Yes Florian Buff, MD  MINIVELLE 0.1 MG/24HR patch APPLY 1 PATCH TWICE WEEKLY 11/24/16  Yes Derrek Monaco A, NP  pantoprazole (PROTONIX) 40 MG tablet Take 1 tablet (40 mg total) by mouth daily. Take 30 minutes before breakfast. 03/05/17  Yes Annitta Needs, NP  clotrimazole-betamethasone (LOTRISONE) cream Apply 1 application at bedtime topically.    [provider]  magnesium gluconate (MAGONATE) 500 MG tablet Take 500 mg by mouth daily.    [provider]  polyethylene glycol-electrolytes (TRILYTE) 420 g solution Take 4,000 mLs by mouth as directed. 02/28/17   Ariadna Setter, Cristopher Estimable, MD  valACYclovir (VALTREX) 1000 MG tablet Take 2 in am and 2 in pm as  needed for cold sores Patient taking differently: Take 2,000 mg 2 (two) times daily as needed by mouth (for cold sores).  07/06/16   Estill Dooms, NP    Allergies as of 02/28/2017 - Review Complete 02/28/2017  Allergen Reaction Noted  . Iodine Anaphylaxis 08/26/2012  . Shellfish allergy Anaphylaxis 08/26/2012  . Ciprofloxacin Other (See Comments) 12/30/2012  . Other Other (See Comments) 12/30/2012  . Septra [sulfamethoxazole-trimethoprim] Hives 08/26/2012  . Latex Rash 08/26/2012  . Synthroid [levothyroxine sodium] Palpitations 12/27/2012     Family History  Problem Relation Age of Onset  . Thyroid disease Mother   . Cancer Father        lung  . Cancer Maternal Grandmother 40       breast   . Colon cancer Maternal Grandfather   . Crohn's disease Sister     Social History   Socioeconomic History  . Marital status: Legally Separated    Spouse name: Not on file  . Number of children: Not on file  . Years of education: Not on file  . Highest education level: Not on file  Social Needs  . Financial resource strain: Not on file  . Food insecurity - worry: Not on file  . Food insecurity - inability: Not on file  . Transportation needs - medical: Not on file  . Transportation needs - non-medical: Not on file  Occupational History  . Not on file  Tobacco Use  . Smoking status: Former Smoker    Packs/day: 0.50    Years: 5.00    Pack years: 2.50    Types: Cigarettes  . Smokeless tobacco: Never Used  . Tobacco comment: Quit in 1996  Substance and Sexual Activity  . Alcohol use: Yes    Comment: occ wine  . Drug use: No  . Sexual activity: Not Currently    Birth control/protection: Surgical    Comment: hyst  Other Topics Concern  . Not on file  Social History Narrative  . Not on file    Review of Systems: See HPI, otherwise negative ROS  Physical Exam: BP (!) 111/58   Pulse 68   Temp 97.9 F (36.6 C) (Oral)   SpO2 99%  General:   Alert,  Well-developed, well-nourished, pleasant and cooperative in NAD. Accompanied by son and daughter  Neck:  Supple; no masses or thyromegaly. No significant cervical adenopathy. Lungs:  Clear throughout to auscultation.   No wheezes, crackles, or rhonchi. No acute distress. Heart:  Regular rate and rhythm; no murmurs, clicks, rubs,  or gallops. Abdomen: Non-distended, normal bowel sounds.  Soft and nontender without appreciable mass or hepatosplenomegaly.  Pulses:  Normal pulses noted. Extremities:  Without clubbing or edema. Impression/ Plan:  Pleasant 51 year old lady  here for screening colonoscopy and EGD with possible esophageal dilation to further evaluate/treat dysphagia per plan.  The risks, benefits, limitations, imponderables and alternatives regarding both EGD and colonoscopy have been reviewed with the patient. Questions have been answered. All parties agreeable.     Notice: This dictation was prepared with Dragon dictation along with smaller phrase technology. Any transcriptional errors that result from this process are unintentional and may not be corrected upon review.

## 2017-04-11 NOTE — Discharge Instructions (Signed)
GERD information provided  Begin Protonix 40 mg daily  Colon diverticulosis information provided  Repeat colonoscopy in 10 years for screening purposes  Office visit with [place] in 3 months - Vicente Males Friday January 18th at 9:00  Further recommendations to follow pending review of pathology report     Diverticulosis Diverticulosis is a condition that develops when small pouches (diverticula) form in the wall of the large intestine (colon). The colon is where water is absorbed and stool is formed. The pouches form when the inside layer of the colon pushes through weak spots in the outer layers of the colon. You may have a few pouches or many of them. What are the causes? The cause of this condition is not known. What increases the risk? The following factors may make you more likely to develop this condition:  Being older than age 47. Your risk for this condition increases with age. Diverticulosis is rare among people younger than age 66. By age 40, many people have it.  Eating a low-fiber diet.  Having frequent constipation.  Being overweight.  Not getting enough exercise.  Smoking.  Taking over-the-counter pain medicines, like aspirin and ibuprofen.  Having a family history of diverticulosis.  What are the signs or symptoms? In most people, there are no symptoms of this condition. If you do have symptoms, they may include:  Bloating.  Cramps in the abdomen.  Constipation or diarrhea.  Pain in the lower left side of the abdomen.  How is this diagnosed? This condition is most often diagnosed during an exam for other colon problems. Because diverticulosis usually has no symptoms, it often cannot be diagnosed independently. This condition may be diagnosed by:  Using a flexible scope to examine the colon (colonoscopy).  Taking an X-ray of the colon after dye has been put into the colon (barium enema).  Doing a CT scan.  How is this treated? You may not need treatment  for this condition if you have never developed an infection related to diverticulosis. If you have had an infection before, treatment may include:  Eating a high-fiber diet. This may include eating more fruits, vegetables, and grains.  Taking a fiber supplement.  Taking a live bacteria supplement (probiotic).  Taking medicine to relax your colon.  Taking antibiotic medicines.  Follow these instructions at home:  Drink 6-8 glasses of water or more each day to prevent constipation.  Try not to strain when you have a bowel movement.  If you have had an infection before: ? Eat more fiber as directed by your health care provider or your diet and nutrition specialist (dietitian). ? Take a fiber supplement or probiotic, if your health care provider approves.  Take over-the-counter and prescription medicines only as told by your health care provider.  If you were prescribed an antibiotic, take it as told by your health care provider. Do not stop taking the antibiotic even if you start to feel better.  Keep all follow-up visits as told by your health care provider. This is important. Contact a health care provider if:  You have pain in your abdomen.  You have bloating.  You have cramps.  You have not had a bowel movement in 3 days. Get help right away if:  Your pain gets worse.  Your bloating becomes very bad.  You have a fever or chills, and your symptoms suddenly get worse.  You vomit.  You have bowel movements that are bloody or black.  You have bleeding from your rectum.  Summary  Diverticulosis is a condition that develops when small pouches (diverticula) form in the wall of the large intestine (colon).  You may have a few pouches or many of them.  This condition is most often diagnosed during an exam for other colon problems.  If you have had an infection related to diverticulosis, treatment may include increasing the fiber in your diet, taking supplements, or  taking medicines. This information is not intended to replace advice given to you by your health care provider. Make sure you discuss any questions you have with your health care provider. Document Released: 01/27/2004 Document Revised: 03/20/2016 Document Reviewed: 03/20/2016 Elsevier Interactive Patient Education  2017 [employer] Heartburn Heartburn is a type of pain or discomfort that can happen in the throat or chest. It is often described as a burning pain. It may also cause a bad taste in the mouth. Heartburn may feel worse when you lie down or bend over, and it is often worse at night. Heartburn may be caused by stomach contents that move back up into the esophagus (reflux). Follow these instructions at home: Take these actions to decrease your discomfort and to help avoid complications. Diet  Follow a diet as recommended by your health care provider. This may involve avoiding foods and drinks such as: ? Coffee and tea (with or without caffeine). ? Drinks that contain alcohol. ? Energy drinks and sports drinks. ? Carbonated drinks or sodas. ? Chocolate and cocoa. ? Peppermint and mint flavorings. ? Garlic and onions. ? Horseradish. ? Spicy and acidic foods, including peppers, chili powder, curry powder, vinegar, hot sauces, and barbecue sauce. ? Citrus fruit juices and citrus fruits, such as oranges, lemons, and limes. ? Tomato-based foods, such as red sauce, chili, salsa, and pizza with red sauce. ? Fried and fatty foods, such as donuts, french fries, potato chips, and high-fat dressings. ? High-fat meats, such as hot dogs and fatty cuts of red and white meats, such as rib eye steak, sausage, ham, and bacon. ? High-fat dairy items, such as whole milk, butter, and cream cheese.  Eat small, frequent meals instead of large meals.  Avoid drinking large amounts of liquid with your meals.  Avoid eating meals during the 2-3 hours before bedtime.  Avoid lying down right after you  eat.  Do not exercise right after you eat. General instructions  Pay attention to any changes in your symptoms.  Take over-the-counter and prescription medicines only as told by your health care provider. Do not take aspirin, ibuprofen, or other NSAIDs unless your health care provider told you to do so.  Do not use any tobacco products, including cigarettes, chewing tobacco, and e-cigarettes. If you need help quitting, ask your health care provider.  Wear loose-fitting clothing. Do not wear anything tight around your waist that causes pressure on your abdomen.  Raise (elevate) the head of your bed about 6 inches (15 cm).  Try to reduce your stress, such as with yoga or meditation. If you need help reducing stress, ask your health care provider.  If you are overweight, reduce your weight to an amount that is healthy for you. Ask your health care provider for guidance about a safe weight loss goal.  Keep all follow-up visits as told by your health care provider. This is important. Contact a health care provider if:  You have new symptoms.  You have unexplained weight loss.  You have difficulty swallowing, or it hurts to swallow.  You have wheezing or a  persistent cough.  Your symptoms do not improve with treatment.  You have frequent heartburn for more than two weeks. Get help right away if:  You have pain in your arms, neck, jaw, teeth, or back.  You feel sweaty, dizzy, or light-headed.  You have chest pain or shortness of breath.  You vomit and your vomit looks like blood or coffee grounds.  Your stool is bloody or black. This information is not intended to replace advice given to you by your health care provider. Make sure you discuss any questions you have with your health care provider. Document Released: 09/17/2008 Document Revised: 10/07/2015 Document Reviewed: 08/26/2014 Elsevier Interactive Patient Education  2017 [employer].  Colonoscopy Discharge  Instructions  Read the instructions outlined below and refer to this sheet in the next few weeks. These discharge instructions provide you with general information on caring for yourself after you leave the hospital. Your doctor may also give you specific instructions. While your treatment has been planned according to the most current medical practices available, unavoidable complications occasionally occur. If you have any problems or questions after discharge, call Dr. Gala Romney at [phone]. ACTIVITY  You may resume your regular activity, but move at a slower pace for the next 24 hours.   Take frequent rest periods for the next 24 hours.   Walking will help get rid of the air and reduce the bloated feeling in your belly (abdomen).   No driving for 24 hours (because of the medicine (anesthesia) used during the test).    Do not sign any important legal documents or operate any machinery for 24 hours (because of the anesthesia used during the test).  NUTRITION  Drink plenty of fluids.   You may resume your normal diet as instructed by your doctor.   Begin with a light meal and progress to your normal diet. Heavy or fried foods are harder to digest and may make you feel sick to your stomach (nauseated).   Avoid alcoholic beverages for 24 hours or as instructed.  MEDICATIONS  You may resume your normal medications unless your doctor tells you otherwise.  WHAT YOU CAN EXPECT TODAY  Some feelings of bloating in the abdomen.   Passage of more gas than usual.   Spotting of blood in your stool or on the toilet paper.  IF YOU HAD POLYPS REMOVED DURING THE COLONOSCOPY:  No aspirin products for 7 days or as instructed.   No alcohol for 7 days or as instructed.   Eat a soft diet for the next 24 hours.  FINDING OUT THE RESULTS OF YOUR TEST Not all test results are available during your visit. If your test results are not back during the visit, make an appointment with your caregiver to find  out the results. Do not assume everything is normal if you have not heard from your caregiver or the medical facility. It is important for you to follow up on all of your test results.  SEEK IMMEDIATE MEDICAL ATTENTION IF:  You have more than a spotting of blood in your stool.   Your belly is swollen (abdominal distention).   You are nauseated or vomiting.   You have a temperature over 101.   You have abdominal pain or discomfort that is severe or gets worse throughout the day.   EGD Discharge instructions Please read the instructions outlined below and refer to this sheet in the next few weeks. These discharge instructions provide you with general information on caring for yourself  after you leave the hospital. Your doctor may also give you specific instructions. While your treatment has been planned according to the most current medical practices available, unavoidable complications occasionally occur. If you have any problems or questions after discharge, please call your doctor. ACTIVITY  You may resume your regular activity but move at a slower pace for the next 24 hours.   Take frequent rest periods for the next 24 hours.   Walking will help expel (get rid of) the air and reduce the bloated feeling in your abdomen.   No driving for 24 hours (because of the anesthesia (medicine) used during the test).   You may shower.   Do not sign any important legal documents or operate any machinery for 24 hours (because of the anesthesia used during the test).  NUTRITION  Drink plenty of fluids.   You may resume your normal diet.   Begin with a light meal and progress to your normal diet.   Avoid alcoholic beverages for 24 hours or as instructed by your caregiver.  MEDICATIONS  You may resume your normal medications unless your caregiver tells you otherwise.  WHAT YOU CAN EXPECT TODAY  You may experience abdominal discomfort such as a feeling of fullness or gas pains.    FOLLOW-UP  Your doctor will discuss the results of your test with you.  SEEK IMMEDIATE MEDICAL ATTENTION IF ANY OF THE FOLLOWING OCCUR:  Excessive nausea (feeling sick to your stomach) and/or vomiting.   Severe abdominal pain and distention (swelling).   Trouble swallowing.   Temperature over 101 F (37.8 C).   Rectal bleeding or vomiting of blood.

## 2017-04-11 NOTE — Anesthesia Postprocedure Evaluation (Signed)
Anesthesia Post Note  Patient: Mary Oconnell  Procedure(s) Performed: COLONOSCOPY WITH PROPOFOL (N/A ) ESOPHAGOGASTRODUODENOSCOPY (EGD) WITH PROPOFOL (N/A ) MALONEY DILATION (N/A ) BIOPSY  Patient location during evaluation: PACU Anesthesia Type: MAC Level of consciousness: awake and alert, oriented and patient cooperative Pain management: pain level controlled Vital Signs Assessment: post-procedure vital signs reviewed and stable Respiratory status: spontaneous breathing and patient connected to nasal cannula oxygen Cardiovascular status: stable Postop Assessment: no apparent nausea or vomiting Anesthetic complications: no     Last Vitals:  Vitals:   04/11/17 1017  BP: (!) 111/58  Pulse: 68  Temp: 36.6 C  SpO2: 99%    Last Pain:  Vitals:   04/11/17 1017  TempSrc: Oral                 ADAMS, AMY A

## 2017-04-11 NOTE — Progress Notes (Signed)
Pre-op labs with potassium just under normal at 3.4. Procedure today. Let's have her recheck a BMP tomorrow. I anticipate this will be normal, but if not, we will provide a short course of supplementation.

## 2017-04-11 NOTE — Transfer of Care (Signed)
Immediate Anesthesia Transfer of Care Note  Patient: Mary Oconnell  Procedure(s) Performed: COLONOSCOPY WITH PROPOFOL (N/A ) ESOPHAGOGASTRODUODENOSCOPY (EGD) WITH PROPOFOL (N/A ) MALONEY DILATION (N/A ) BIOPSY  Patient Location: PACU  Anesthesia Type:MAC  Level of Consciousness: awake, oriented and patient cooperative  Airway & Oxygen Therapy: Patient Spontanous Breathing  Post-op Assessment: Report given to RN and Post -op Vital signs reviewed and stable  Post vital signs: Reviewed and stable  Last Vitals:  Vitals:   04/11/17 1017  BP: (!) 111/58  Pulse: 68  Temp: 36.6 C  SpO2: 99%    Last Pain:  Vitals:   04/11/17 1017  TempSrc: Oral         Complications: No apparent anesthesia complications

## 2017-04-11 NOTE — Op Note (Addendum)
[facility] Patient Name: Mary Oconnell Procedure Date: 04/11/2017 11:35 AM MRN: [id number] Date of Birth: Jun 02, 1965 Attending MD: Norvel Richards , MD CSN: [id number] Age: 51 Admit Type: Outpatient Procedure:                Upper GI endoscopy Indications:              Dysphagia Providers:                Norvel Richards, MD, Lurline Del, RN, Rosina Lowenstein, RN Referring MD:              Medicines:                Propofol per Anesthesia Complications:            No immediate complications. Estimated Blood Loss:     Estimated blood loss was minimal. Procedure:                Pre-Anesthesia Assessment:                           - Prior to the procedure, a History and Physical                            was performed, and patient medications and                            allergies were reviewed. The patient's tolerance of                            previous anesthesia was also reviewed. The risks                            and benefits of the procedure and the sedation                            options and risks were discussed with the patient.                            All questions were answered, and informed consent                            was obtained. Prior Anticoagulants: The patient has                            taken no previous anticoagulant or antiplatelet                            agents. ASA Grade Assessment: II - A patient with                            mild systemic disease. After reviewing the risks  and benefits, the patient was deemed in                            satisfactory condition to undergo the procedure.                           After obtaining informed consent, the endoscope was                            passed under direct vision. Throughout the                            procedure, the patient's blood pressure, pulse, and                            oxygen saturations were monitored  continuously. The                            [id number]) scope was introduced through the                            and advanced to the second part of duodenum. The                            upper GI endoscopy was accomplished without                            difficulty. The patient tolerated the procedure                            well. Scope In: 11:57:47 AM Scope Out: 12:03:50 PM Total Procedure Duration: 0 hours 6 minutes 3 seconds  Findings:      The examined esophagus was normal. The scope was withdrawn. Dilation was       performed with a Maloney dilator with mild resistance at 30 Fr. The       dilation site was examined following endoscope reinsertion and showed no       change. Estimated blood loss: none.      The entire examined stomach was normal.      Multiple erosions without bleeding were found in the second portion of       the duodenum. This was biopsied with a cold forceps for histology. Impression:               - Normal esophagus. Dilated.                           - Normal stomach.                           - Duodenal erosions without bleeding. Biopsied. Moderate Sedation:      Moderate (conscious) sedation was personally administered by an       anesthesia professional. The following parameters were monitored: oxygen       saturation, heart rate, blood pressure, respiratory rate, EKG, adequacy       of pulmonary ventilation, and response to care. Total  physician       intraservice time was 12 minutes. Recommendation:           - Patient has a contact number available for                            emergencies. The signs and symptoms of potential                            delayed complications were discussed with the                            patient. Return to normal activities tomorrow.                            Written discharge instructions were provided to the                            patient.                           - Continue present  medications. Begin Protonix 40                            mg once daily                           - Await pathology results.                           - No repeat upper endoscopy.                           - Return to GI office in 3 months. See colonoscopy                            report.                           - Resume previous diet. Procedure Code(s):        --- Professional ---                           [phone], Esophagogastroduodenoscopy, flexible,                            transoral; with biopsy, single or multiple                           43450, Dilation of esophagus, by unguided sound or                            bougie, single or multiple passes Diagnosis Code(s):        --- Professional ---                           K26.9, Duodenal ulcer, unspecified as acute or  chronic, without hemorrhage or perforation                           R13.10, Dysphagia, unspecified CPT copyright 2016 American Medical Association. All rights reserved. The codes documented in this report are preliminary and upon coder review may  be revised to meet current compliance requirements. Cristopher Estimable. Ahmad Vanwey, MD Norvel Richards, MD 04/11/2017 12:31:41 PM This report has been signed electronically. Number of Addenda: 0

## 2017-04-11 NOTE — Anesthesia Preprocedure Evaluation (Signed)
Anesthesia Evaluation  Patient identified by MRN, date of birth, ID band Patient awake    Reviewed: Allergy & Precautions, H&P , NPO status , Patient's Chart, lab work & pertinent test results  Airway Mallampati: II  TM Distance: >3 FB Neck ROM: Full    Dental  (+) Edentulous Upper   Pulmonary asthma , former smoker,    breath sounds clear to auscultation       Cardiovascular negative cardio ROS   Rhythm:Regular Rate:Normal     Neuro/Psych PSYCHIATRIC DISORDERS Anxiety    GI/Hepatic GERD (nauseated today)  Controlled,  Endo/Other  Hypothyroidism   Renal/GU      Musculoskeletal  (+) Fibromyalgia -  Abdominal   Peds  Hematology   Anesthesia Other Findings   Reproductive/Obstetrics                             Anesthesia Physical Anesthesia Plan  ASA: II  Anesthesia Plan: MAC   Post-op Pain Management:    Induction: Intravenous  PONV Risk Score and Plan:   Airway Management Planned: Simple Face Mask  Additional Equipment:   Intra-op Plan:   Post-operative Plan:   Informed Consent: I have reviewed the patients History and Physical, chart, labs and discussed the procedure including the risks, benefits and alternatives for the proposed anesthesia with the patient or authorized representative who has indicated his/her understanding and acceptance.     Plan Discussed with:   Anesthesia Plan Comments:         Anesthesia Quick Evaluation

## 2017-04-11 NOTE — Op Note (Signed)
[facility] Patient Name: Mary Oconnell Procedure Date: 04/11/2017 12:05 PM MRN: [id number] Date of Birth: 17-Dec-1965 Attending MD: Norvel Richards , MD CSN: [id number] Age: 51 Admit Type: Ambulatory Procedure:                Colonoscopy Indications:              Screening for colorectal malignant neoplasm Providers:                Norvel Richards, MD, Lurline Del, RN, Rosina Lowenstein, RN Referring MD:              Medicines:                Propofol per Anesthesia Complications:            No immediate complications. Estimated Blood Loss:     Estimated blood loss: none. Procedure:                Pre-Anesthesia Assessment:                           - Prior to the procedure, a History and Physical                            was performed, and patient medications and                            allergies were reviewed. The patient's tolerance of                            previous anesthesia was also reviewed. The risks                            and benefits of the procedure and the sedation                            options and risks were discussed with the patient.                            All questions were answered, and informed consent                            was obtained. Prior Anticoagulants: The patient has                            taken no previous anticoagulant or antiplatelet                            agents. ASA Grade Assessment: II - A patient with                            mild systemic disease. After reviewing the risks  and benefits, the patient was deemed in                            satisfactory condition to undergo the procedure.                           After obtaining informed consent, the colonoscope                            was passed under direct vision. Throughout the                            procedure, the patient's blood pressure, pulse, and                            oxygen  saturations were monitored continuously. The                            [id number] ([id number]) scope was introduced through                            the and advanced to the the cecum, identified by                            appendiceal orifice and ileocecal valve. The                            colonoscopy was performed without difficulty. The                            patient tolerated the procedure well. The entire                            colon was well visualized. The ileocecal valve,                            appendiceal orifice, and rectum were photographed.                            The quality of the bowel preparation was adequate. Scope In: 97:58:83 PM Scope Out: 12:20:56 PM Scope Withdrawal Time: 0 hours 6 minutes 38 seconds  Total Procedure Duration: 0 hours 12 minutes 10 seconds  Findings:      The perianal and digital rectal examinations were normal.      A few small-mouthed diverticula were found in the sigmoid colon. Mild       melanosis coli present      The exam was otherwise without abnormality on direct and retroflexion       views. Impression:               - Diverticulosis in the sigmoid colon. Mild                            melanosis coli                           -  The examination was otherwise normal on direct                            and retroflexion views.                           - No specimens collected. Moderate Sedation:      Moderate (conscious) sedation was personally administered by an       anesthesia professional. The following parameters were monitored: oxygen       saturation, heart rate, blood pressure, respiratory rate, EKG, adequacy       of pulmonary ventilation, and response to care. Total physician       intraservice time was 29 minutes. Recommendation:           - Patient has a contact number available for                            emergencies. The signs and symptoms of potential                            delayed complications  were discussed with the                            patient. Return to normal activities tomorrow.                            Written discharge instructions were provided to the                            patient.                           - Resume previous diet.                           - Continue present medications.                           - Repeat colonoscopy in 10 years for screening                            purposes.                           - Return to GI clinic in 3 months. See EGD report. Procedure Code(s):        --- Professional ---                           [id number], Colonoscopy, flexible; diagnostic, including                            collection of specimen(s) by brushing or washing,                            when performed (separate procedure) Diagnosis Code(s):        --- Professional ---  Z12.11, Encounter for screening for malignant                            neoplasm of colon                           K57.30, Diverticulosis of large intestine without                            perforation or abscess without bleeding CPT copyright 2016 American Medical Association. All rights reserved. The codes documented in this report are preliminary and upon coder review may  be revised to meet current compliance requirements. Cristopher Estimable. Amita Atayde, MD Norvel Richards, MD 04/11/2017 4:52:24 PM This report has been signed electronically. Number of Addenda: 0

## 2017-04-11 NOTE — Anesthesia Procedure Notes (Signed)
Procedure Name: MAC Date/Time: 04/11/2017 11:45 AM Performed by: Andree Elk Travonne Schowalter A, CRNA Pre-anesthesia Checklist: Patient identified, Emergency Drugs available, Suction available, Patient being monitored and Timeout performed Oxygen Delivery Method: Simple face mask

## 2017-04-12 ENCOUNTER — Encounter: Payer: Self-pay | Admitting: Internal Medicine

## 2017-04-13 ENCOUNTER — Telehealth: Payer: Self-pay | Admitting: Internal Medicine

## 2017-04-13 NOTE — Telephone Encounter (Signed)
Pt had colonoscopy and EGD done on Wednesday and ever since she has had pain running across her shoulders. She is asking if she can take Ibuprofrin for pain. Please advise and call [phone]

## 2017-04-13 NOTE — Telephone Encounter (Signed)
Pt is having pain across her shoulders and upper abdomen s/p EGD. Pt took tylenol with no relief yesterday and would like to take ibuprofen. Please advise.

## 2017-04-15 NOTE — Telephone Encounter (Signed)
Shoulder pain should resolve.  Would limit Ibuprofen to one or 2 doses only

## 2017-04-16 ENCOUNTER — Telehealth: Payer: Self-pay | Admitting: Internal Medicine

## 2017-04-16 NOTE — Telephone Encounter (Signed)
Tried calling pt a send time, VM is full, not able to leave a message

## 2017-04-16 NOTE — Telephone Encounter (Signed)
Pt said that RMR did a procedure on her 11/28 and she needs a refill on her ativan 0.5 mg and she uses [facility] in [place].

## 2017-04-16 NOTE — Telephone Encounter (Signed)
Tried calling a second time, VM is full, not able to leave a message.

## 2017-04-16 NOTE — Telephone Encounter (Signed)
Please call patient in reference to her medications

## 2017-04-16 NOTE — Telephone Encounter (Signed)
Tried calling a third time, VM is full.

## 2017-04-16 NOTE — Telephone Encounter (Signed)
Tried calling pt, no voice mail is set up. See other note

## 2017-04-16 NOTE — Telephone Encounter (Signed)
Will call pt back, mailbox is full.

## 2017-04-17 ENCOUNTER — Encounter (HOSPITAL_COMMUNITY): Payer: Self-pay | Admitting: Internal Medicine

## 2017-04-18 ENCOUNTER — Other Ambulatory Visit: Payer: Self-pay

## 2017-04-18 DIAGNOSIS — R4702 Dysphasia: Secondary | ICD-10-CM

## 2017-04-18 NOTE — Telephone Encounter (Signed)
Pt returned call. Pt was notified of how often to use ibuprofen if needed. Pt reports that her symptoms have resolved since she last called the office.

## 2017-04-18 NOTE — Telephone Encounter (Signed)
Spoke with pt, our office didn't call in the rx for pt, pt advised to call the office that prescribed med for refills.

## 2017-04-20 DIAGNOSIS — R4702 Dysphasia: Secondary | ICD-10-CM | POA: Diagnosis not present

## 2017-04-20 LAB — BASIC METABOLIC PANEL
BUN: 9 mg/dL (ref 7–25)
CO2: 29 mmol/L (ref 20–32)
CREATININE: 0.94 mg/dL (ref 0.50–1.05)
Calcium: 9.1 mg/dL (ref 8.6–10.4)
Chloride: 107 mmol/L (ref 98–110)
GLUCOSE: 79 mg/dL (ref 65–139)
Potassium: 4.4 mmol/L (ref 3.5–5.3)
Sodium: 141 mmol/L (ref 135–146)

## 2017-05-03 ENCOUNTER — Other Ambulatory Visit: Payer: Self-pay | Admitting: Adult Health

## 2017-05-04 ENCOUNTER — Telehealth: Payer: Self-pay | Admitting: Internal Medicine

## 2017-05-04 NOTE — Telephone Encounter (Signed)
FYI AB I tried to call pt twice. I wasn't able to triage pt.

## 2017-05-04 NOTE — Telephone Encounter (Signed)
[phone]  PLEASE CALL PATIENT, SHE WOULD LIKE TO KNOW IF Mary Oconnell COULD CALL SOMETHING IN FOR HER STOMACH, SHE STATED IT FEELS LIKE THERE IS A VIBRATOR IN IT AND IT FEELS NERVOUS.

## 2017-05-10 NOTE — Telephone Encounter (Signed)
Noted. Will await call from patient.

## 2017-06-01 ENCOUNTER — Encounter: Payer: Self-pay | Admitting: Gastroenterology

## 2017-06-01 ENCOUNTER — Telehealth: Payer: Self-pay | Admitting: Gastroenterology

## 2017-06-01 ENCOUNTER — Ambulatory Visit: Payer: BLUE CROSS/BLUE SHIELD | Admitting: Gastroenterology

## 2017-06-01 NOTE — Telephone Encounter (Signed)
Patient was a no show and letter sent  °

## 2017-06-13 DIAGNOSIS — F419 Anxiety disorder, unspecified: Secondary | ICD-10-CM | POA: Diagnosis not present

## 2017-06-13 DIAGNOSIS — Z91040 Latex allergy status: Secondary | ICD-10-CM | POA: Diagnosis not present

## 2017-06-13 DIAGNOSIS — R1013 Epigastric pain: Secondary | ICD-10-CM | POA: Diagnosis not present

## 2017-06-13 DIAGNOSIS — T7849XA Other allergy, initial encounter: Secondary | ICD-10-CM | POA: Diagnosis not present

## 2017-06-13 DIAGNOSIS — T7840XA Allergy, unspecified, initial encounter: Secondary | ICD-10-CM | POA: Diagnosis not present

## 2017-06-13 DIAGNOSIS — K219 Gastro-esophageal reflux disease without esophagitis: Secondary | ICD-10-CM | POA: Diagnosis not present

## 2017-06-13 DIAGNOSIS — T781XXA Other adverse food reactions, not elsewhere classified, initial encounter: Secondary | ICD-10-CM | POA: Diagnosis not present

## 2017-06-13 DIAGNOSIS — R238 Other skin changes: Secondary | ICD-10-CM | POA: Diagnosis not present

## 2017-08-13 DIAGNOSIS — F419 Anxiety disorder, unspecified: Secondary | ICD-10-CM | POA: Diagnosis not present

## 2017-08-13 DIAGNOSIS — Z1389 Encounter for screening for other disorder: Secondary | ICD-10-CM | POA: Diagnosis not present

## 2017-08-13 DIAGNOSIS — Z6825 Body mass index (BMI) 25.0-25.9, adult: Secondary | ICD-10-CM | POA: Diagnosis not present

## 2017-08-13 DIAGNOSIS — E663 Overweight: Secondary | ICD-10-CM | POA: Diagnosis not present

## 2017-08-13 DIAGNOSIS — I8392 Asymptomatic varicose veins of left lower extremity: Secondary | ICD-10-CM | POA: Diagnosis not present

## 2017-09-13 ENCOUNTER — Telehealth: Payer: Self-pay | Admitting: Family Medicine

## 2017-09-13 ENCOUNTER — Telehealth: Payer: Self-pay | Admitting: Emergency Medicine

## 2017-09-13 ENCOUNTER — Ambulatory Visit
Admission: EM | Admit: 2017-09-13 | Discharge: 2017-09-13 | Disposition: A | Discharge status: Home / Self Care | Payer: BLUE CROSS/BLUE SHIELD | Attending: Family Medicine | Admitting: Family Medicine

## 2017-09-13 ENCOUNTER — Other Ambulatory Visit: Payer: Self-pay

## 2017-09-13 DIAGNOSIS — N3001 Acute cystitis with hematuria: Secondary | ICD-10-CM

## 2017-09-13 LAB — URINALYSIS, COMPLETE (UACMP) WITH MICROSCOPIC
Bilirubin Urine: NEGATIVE
GLUCOSE, UA: NEGATIVE mg/dL
Ketones, ur: NEGATIVE mg/dL
Nitrite: NEGATIVE
PH: 6.5 (ref 5.0–8.0)
PROTEIN: 30 mg/dL — AB
RBC / HPF: >50 RBC/hpf (ref 0–5)
Specific Gravity, Urine: 1.015 (ref 1.005–1.030)
WBC, UA: >50 WBC/hpf (ref 0–5)

## 2017-09-13 MED ORDER — FLUCONAZOLE 150 MG PO TABS: 150.0000 mg | ORAL_TABLET | Freq: Every day | ORAL | 0 refills | Status: DC

## 2017-09-13 MED ORDER — CEPHALEXIN 500 MG PO CAPS: 500.0000 mg | ORAL_CAPSULE | Freq: Two times a day (BID) | ORAL | 0 refills | Status: DC

## 2017-09-13 NOTE — ED Triage Notes (Signed)
Patient complains of low back pain, urinary frequency, hematuria, nausea, urinary pressure, dizziness and chills that started yesterday but worsened throughout the night

## 2017-09-13 NOTE — Telephone Encounter (Signed)
Patient seen today and given Keflex. Patient called and states she always gets a yeast infection when she takes antibiotics, could we send diflucan in for her

## 2017-09-13 NOTE — Telephone Encounter (Signed)
Requesting diflucan. Sending in.

## 2017-09-13 NOTE — ED Provider Notes (Signed)
[facility]    CSN: [id number] Arrival date & time: 09/13/17  0930  History   Chief Complaint Chief Complaint  Patient presents with  . Urinary Frequency   HPI  52 year old female presents with the above complaint.  Patient is concerned that she has urinary tract infection.  She states that she has had a multitude of symptoms since yesterday.  She reports back pain, lower abdominal pain, hematuria, dysuria, frequency, headache, nausea, chills.  Patient believes she has urinary tract infection.  No fever.  No reports of flank pain.  No known exacerbating or relieving factors.  No other reported symptoms.  No other complaints.  Past Medical History:  Diagnosis Date  . Allergy   . Anxiety   . Asthma   . Breast cyst    Right-cyst was drained in March 2014  . Burning with urination 01/11/2015  . BV (bacterial vaginosis) 09/30/2015  . Cold sore 06/19/2014  . Constipated 05/27/2013  . Current use of estrogen therapy 09/21/2015  . Fibroid   . Fibromyalgia   . Hematuria 01/11/2015  . History of cold sores   . Hypothyroid 06/26/2013  . IBS (irritable bowel syndrome)   . Pain, abdominal, RLQ 06/17/2013  . RLQ abdominal pain 06/01/2014  . Urinary frequency 06/17/2013  . Vaginal discharge 09/30/2015  . Vaginal pain 09/30/2015  . Vertigo   . Vitamin D deficiency   . Yeast infection 05/27/2013    Patient Active Problem List   Diagnosis Date Noted  . Encounter for screening colonoscopy 02/28/2017  . BV (bacterial vaginosis) 09/30/2015  . Current use of estrogen therapy 09/21/2015  . Cold sore 06/19/2014  . Hypothyroid 06/26/2013  . OA (osteoarthritis) of finger 06/17/2013  . Constipation 05/27/2013  . Breast mass 08/26/2012  . Anxiety 08/26/2012  . H/O estrogen therapy 08/26/2012    Past Surgical History:  Procedure Laterality Date  . ABDOMINAL HYSTERECTOMY    . BIOPSY  04/11/2017   Procedure: BIOPSY;  Surgeon: Daneil Dolin, MD;  Location: [facility];  Service:  Endoscopy;;  duodenal  . CARPAL TUNNEL RELEASE Left 12/31/2012   Procedure: LEFT CARPAL TUNNEL RELEASE;  Surgeon: Sanjuana Kava, MD;  Location: [facility];  Service: Orthopedics;  Laterality: Left;  . CHOLECYSTECTOMY N/A 04/18/2013   Procedure: LAPAROSCOPIC CHOLECYSTECTOMY;  Surgeon: Jamesetta So, MD;  Location: [facility];  Service: General;  Laterality: N/A;  . COLONOSCOPY WITH PROPOFOL N/A 04/11/2017   Procedure: COLONOSCOPY WITH PROPOFOL;  Surgeon: Daneil Dolin, MD;  Location: [facility];  Service: Endoscopy;  Laterality: N/A;  12:00pm  . ESOPHAGOGASTRODUODENOSCOPY (EGD) WITH PROPOFOL N/A 04/11/2017   Procedure: ESOPHAGOGASTRODUODENOSCOPY (EGD) WITH PROPOFOL;  Surgeon: Daneil Dolin, MD;  Location: [facility];  Service: Endoscopy;  Laterality: N/A;  . Venia Minks DILATION N/A 04/11/2017   Procedure: Venia Minks DILATION;  Surgeon: Daneil Dolin, MD;  Location: [facility];  Service: Endoscopy;  Laterality: N/A;  . TONSILLECTOMY    . TUBAL LIGATION      OB History    Gravida  3   Para  3   Term  3   Preterm      AB      Living  3     SAB      TAB      Ectopic      Multiple      Live Births  3            Home Medications    Prior  to Admission medications   Medication Sig Start Date End Date Taking? Authorizing Provider  5-Hydroxytryptophan (5-HTP) 100 MG CAPS Take 100 mg by mouth 2 (two) times daily.   Yes [provider]  albuterol (PROVENTIL HFA;VENTOLIN HFA) 108 (90 Base) MCG/ACT inhaler Inhale 2 puffs into the lungs every 6 (six) hours as needed for wheezing or shortness of breath. Patient taking differently: Inhale 1 puff every 6 (six) hours as needed into the lungs for wheezing or shortness of breath.  09/21/15  Yes Derrek Monaco A, NP  Cholecalciferol (VITAMIN D3 PO) Take 0.5 mLs daily by mouth.   Yes [provider]  fluticasone (FLONASE) 50 MCG/ACT nasal spray Place 1 spray into both nostrils daily.    Yes [provider]    LORazepam (ATIVAN) 0.5 MG tablet Take 1 tablet (0.5 mg total) by mouth 3 (three) times daily. Patient taking differently: Take 0.5 mg daily as needed by mouth for anxiety.  09/18/13  Yes Florian Buff, MD  Multiple Vitamins-Minerals (MULTIVITAMIN WITH MINERALS) tablet Take 1 tablet by mouth daily.   Yes [provider]  pantoprazole (PROTONIX) 40 MG tablet Take 1 tablet (40 mg total) by mouth daily. Take 30 minutes before breakfast. 03/05/17  Yes Annitta Needs, NP  valACYclovir (VALTREX) 1000 MG tablet Take 2 in am and 2 in pm as needed for cold sores Patient taking differently: Take 2,000 mg 2 (two) times daily as needed by mouth (for cold sores).  07/06/16  Yes Derrek Monaco A, NP  cephALEXin (KEFLEX) 500 MG capsule Take 1 capsule (500 mg total) by mouth 2 (two) times daily. 09/13/17   Coral Spikes, DO  MINIVELLE 0.1 MG/24HR patch APPLY 1 PATCH TWICE WEEKLY 05/03/17   Estill Dooms, NP    Family History Family History  Problem Relation Age of Onset  . Thyroid disease Mother   . Cancer Father        lung  . Cancer Maternal Grandmother 40       breast   . Colon cancer Maternal Grandfather   . Crohn's disease Sister     Social History Social History   Tobacco Use  . Smoking status: Former Smoker    Packs/day: 0.50    Years: 5.00    Pack years: 2.50    Types: Cigarettes  . Smokeless tobacco: Never Used  . Tobacco comment: Quit in 1996  Substance Use Topics  . Alcohol use: Yes    Comment: occ wine  . Drug use: No     Allergies   Iodine; Shellfish allergy; Almond (diagnostic); Ciprofloxacin; Other; Septra [sulfamethoxazole-trimethoprim]; Latex; and Synthroid [levothyroxine sodium]   Review of Systems Review of Systems  Constitutional: Positive for chills. Negative for fever.  Gastrointestinal: Positive for abdominal pain and nausea.  Genitourinary: Positive for dysuria, frequency and hematuria.  Musculoskeletal: Positive for back pain.  Neurological:  Positive for headaches.   Physical Exam Triage Vital Signs ED Triage Vitals  Enc Vitals Group     BP 09/13/17 0952 120/63     Pulse Rate 09/13/17 0952 70     Resp 09/13/17 0952 17     Temp 09/13/17 0952 97.8 F (36.6 C)     Temp Source 09/13/17 0952 Oral     SpO2 09/13/17 0952 100 %     Weight 09/13/17 0949 153 lb (69.4 kg)     Height 09/13/17 0949 5\' 5"  (1.651 m)     Head Circumference --      Peak  Flow --      Pain Score 09/13/17 0949 8     Pain Loc --      Pain Edu? --      Excl. in [place]? --    Updated Vital Signs BP 120/63 (BP Location: Right Arm)   Pulse 70   Temp 97.8 F (36.6 C) (Oral)   Resp 17   Ht 5\' 5"  (1.651 m)   Wt 153 lb (69.4 kg)   SpO2 100%   BMI 25.46 kg/m   Physical Exam  Constitutional: She is oriented to person, place, and time. She appears well-developed. No distress.  Cardiovascular: Normal rate and regular rhythm.  Pulmonary/Chest: Effort normal and breath sounds normal. She has no wheezes. She has no rales.  Abdominal: Soft. She exhibits no distension.  Diffusely tender to palpation, mild.  Neurological: She is alert and oriented to person, place, and time.  Psychiatric: She has a normal mood and affect. Her behavior is normal.  Nursing note and vitals reviewed.  UC Treatments / Results  Labs (all labs ordered are listed, but only abnormal results are displayed) Labs Reviewed  URINALYSIS, COMPLETE (UACMP) WITH MICROSCOPIC - Abnormal; Notable for the following components:      Result Value   APPearance HAZY (*)    Hgb urine dipstick LARGE (*)    Protein, ur 30 (*)    Leukocytes, UA MODERATE (*)    Bacteria, UA FEW (*)    All other components within normal limits  URINE CULTURE   EKG None  Radiology No results found.  Procedures Procedures (including critical care time)  Medications Ordered in UC Medications - No data to display  Initial Impression / Assessment and Plan / UC Course  I have reviewed the triage vital signs and  the nursing notes.  Pertinent labs & imaging results that were available during my care of the patient were reviewed by me and considered in my medical decision making (see chart for details).    52 year old female presents with urinary tract infection.  Treating with Keflex.  Sending culture.  Final Clinical Impressions(s) / UC Diagnoses   Final diagnoses:  Acute cystitis with hematuria   ED Prescriptions    Medication Sig Dispense Auth. Provider   cephALEXin (KEFLEX) 500 MG capsule Take 1 capsule (500 mg total) by mouth 2 (two) times daily. 14 capsule Coral Spikes, DO     Controlled Substance Prescriptions [place] Controlled Substance Registry consulted? Not Applicable   Coral Spikes, DO 09/13/17 1050

## 2017-09-14 ENCOUNTER — Ambulatory Visit: Payer: 59 | Admitting: Women's Health

## 2017-09-15 LAB — URINE CULTURE: Culture: >=100000 — AB

## 2017-09-17 NOTE — Progress Notes (Signed)
Urine culture positive for E.Coli, this was treated at urgent care visit with Keflex.

## 2017-10-03 ENCOUNTER — Other Ambulatory Visit: Payer: Self-pay | Admitting: Adult Health

## 2017-10-03 MED ORDER — ESTRADIOL 0.1 MG/24HR TD PTTW: MEDICATED_PATCH | TRANSDERMAL | 3 refills | Status: DC

## 2017-10-03 NOTE — Progress Notes (Signed)
Refilled patch, and needs appt

## 2017-10-17 ENCOUNTER — Other Ambulatory Visit: Payer: Self-pay | Admitting: Adult Health

## 2017-10-29 ENCOUNTER — Other Ambulatory Visit: Payer: Self-pay | Admitting: Adult Health

## 2017-11-13 ENCOUNTER — Other Ambulatory Visit: Payer: Self-pay | Admitting: Adult Health

## 2017-11-20 ENCOUNTER — Other Ambulatory Visit: Payer: Self-pay | Admitting: Adult Health

## 2017-12-05 ENCOUNTER — Other Ambulatory Visit: Payer: Self-pay

## 2017-12-05 ENCOUNTER — Ambulatory Visit (INDEPENDENT_AMBULATORY_CARE_PROVIDER_SITE_OTHER): Payer: BLUE CROSS/BLUE SHIELD | Admitting: Adult Health

## 2017-12-05 ENCOUNTER — Encounter: Payer: Self-pay | Admitting: Adult Health

## 2017-12-05 VITALS — BP 98/63 | HR 68 | Resp 16 | Ht 65.0 in | Wt 157.0 lb

## 2017-12-05 DIAGNOSIS — Z1212 Encounter for screening for malignant neoplasm of rectum: Secondary | ICD-10-CM | POA: Diagnosis not present

## 2017-12-05 DIAGNOSIS — Z01419 Encounter for gynecological examination (general) (routine) without abnormal findings: Secondary | ICD-10-CM

## 2017-12-05 DIAGNOSIS — Z1211 Encounter for screening for malignant neoplasm of colon: Secondary | ICD-10-CM | POA: Insufficient documentation

## 2017-12-05 DIAGNOSIS — Z79899 Other long term (current) drug therapy: Secondary | ICD-10-CM

## 2017-12-05 LAB — HEMOCCULT GUIAC POC 1CARD (OFFICE): Fecal Occult Blood, POC: NEGATIVE

## 2017-12-05 MED ORDER — ESTRADIOL 0.1 MG/24HR TD PTTW: MEDICATED_PATCH | TRANSDERMAL | 12 refills | Status: DC

## 2017-12-05 MED ORDER — FLUCONAZOLE 150 MG PO TABS: ORAL_TABLET | ORAL | 1 refills | Status: DC

## 2017-12-05 MED ORDER — VALACYCLOVIR HCL 1 G PO TABS: ORAL_TABLET | ORAL | 12 refills | Status: DC

## 2017-12-05 NOTE — Progress Notes (Signed)
Patient ID: Mary Oconnell, female   DOB: 1966/03/27, 52 y.o.   MRN: [id number] History of Present Illness: Mary Oconnell is a 52 year old white female, divorced in for well woman gyn exam, she is sp hysterectomy. PCP is Mary Oconnell.    Current Medications, Allergies, Past Medical History, Past Surgical History, Family History and Social History were reviewed in [employer] record.     Review of Systems: Patient denies any headaches, hearing loss, fatigue, blurred vision, shortness of breath, chest pain, abdominal pain, problems with bowel movements, urination, or intercourse. No joint pain or mood swings.Legs ache at times,gets cold sore in summer more often.Still has some hot flashes at night.     Physical Exam:BP 98/63 (BP Location: Right Arm, Patient Position: Sitting, Cuff Size: Normal)   Pulse 68   Resp 16   Ht 5\' 5"  (1.651 m)   Wt 157 lb (71.2 kg)   BMI 26.13 kg/m  General:  Well developed, well nourished, no acute distress Skin:  Warm and dry,tan Neck:  Midline trachea, normal thyroid, good ROM, no lymphadenopathy Lungs; Clear to auscultation bilaterally Breast:  No dominant palpable mass, retraction, or nipple discharge Cardiovascular: Regular rate and rhythm Abdomen:  Soft, non tender, no hepatosplenomegaly Pelvic:  External genitalia is normal in appearance, no lesions.  The vagina is normal in appearance. Urethra has no lesions or masses. The cervix and uterus are absent.  No adnexal masses or tenderness noted.Bladder is non tender, no masses felt. Rectal: Good sphincter tone, no polyps, or hemorrhoids felt.  Hemoccult negative. Extremities/musculoskeletal:  No swelling,+ varicosities noted, no clubbing or cyanosis Psych:  No mood changes, alert and cooperative,seems happy PHQ 2 score 0.  Impression: 1. Encounter for well woman exam with routine gynecological exam   2. Current use of estrogen therapy   3. Screening for colorectal cancer       Plan: Meds  ordered this encounter  Medications  . valACYclovir (VALTREX) 1000 MG tablet    Sig: Take 1 daily    Dispense:  30 tablet    Refill:  12    Order Specific Question:   Supervising Provider    Answer:   Mary Oconnell, Mary Oconnell [[id number]]  . estradiol (MINIVELLE) 0.1 MG/24HR patch    Sig: APPLY 1 PATCH TWICE WEEKLY    Dispense:  8 patch    Refill:  12    Tell pt time for appt    Order Specific Question:   Supervising Provider    Answer:   Mary Oconnell, Mary Oconnell [[id number]]  . fluconazole (DIFLUCAN) 150 MG tablet    Sig: Take 1 now and in 3 days if needed    Dispense:  2 tablet    Refill:  1    Order Specific Question:   Supervising Provider    Answer:   Mary Oconnell [[id number]]  Mammogram yearly Labs fasting next year Physical in 1 year Colonoscopy per GI

## 2018-01-28 DIAGNOSIS — Z6825 Body mass index (BMI) 25.0-25.9, adult: Secondary | ICD-10-CM | POA: Diagnosis not present

## 2018-01-28 DIAGNOSIS — F419 Anxiety disorder, unspecified: Secondary | ICD-10-CM | POA: Diagnosis not present

## 2018-01-28 DIAGNOSIS — E663 Overweight: Secondary | ICD-10-CM | POA: Diagnosis not present

## 2018-01-28 DIAGNOSIS — J309 Allergic rhinitis, unspecified: Secondary | ICD-10-CM | POA: Diagnosis not present

## 2018-01-28 DIAGNOSIS — J019 Acute sinusitis, unspecified: Secondary | ICD-10-CM | POA: Diagnosis not present

## 2018-02-21 DIAGNOSIS — Z1389 Encounter for screening for other disorder: Secondary | ICD-10-CM | POA: Diagnosis not present

## 2018-02-21 DIAGNOSIS — J019 Acute sinusitis, unspecified: Secondary | ICD-10-CM | POA: Diagnosis not present

## 2018-02-21 DIAGNOSIS — E663 Overweight: Secondary | ICD-10-CM | POA: Diagnosis not present

## 2018-02-21 DIAGNOSIS — Z6825 Body mass index (BMI) 25.0-25.9, adult: Secondary | ICD-10-CM | POA: Diagnosis not present

## 2018-02-21 DIAGNOSIS — H6993 Unspecified Eustachian tube disorder, bilateral: Secondary | ICD-10-CM | POA: Diagnosis not present

## 2018-06-03 ENCOUNTER — Other Ambulatory Visit: Payer: Self-pay

## 2018-06-03 ENCOUNTER — Emergency Department (HOSPITAL_COMMUNITY): Payer: BLUE CROSS/BLUE SHIELD

## 2018-06-03 ENCOUNTER — Encounter (HOSPITAL_COMMUNITY): Payer: Self-pay

## 2018-06-03 ENCOUNTER — Emergency Department (HOSPITAL_COMMUNITY)
Admission: EM | Admit: 2018-06-03 | Discharge: 2018-06-03 | Disposition: A | Discharge status: Home / Self Care | Payer: BLUE CROSS/BLUE SHIELD | Attending: Emergency Medicine | Admitting: Emergency Medicine

## 2018-06-03 DIAGNOSIS — Z87891 Personal history of nicotine dependence: Secondary | ICD-10-CM | POA: Diagnosis not present

## 2018-06-03 DIAGNOSIS — Z79899 Other long term (current) drug therapy: Secondary | ICD-10-CM | POA: Insufficient documentation

## 2018-06-03 DIAGNOSIS — J45909 Unspecified asthma, uncomplicated: Secondary | ICD-10-CM | POA: Diagnosis not present

## 2018-06-03 DIAGNOSIS — R109 Unspecified abdominal pain: Secondary | ICD-10-CM | POA: Diagnosis not present

## 2018-06-03 DIAGNOSIS — E663 Overweight: Secondary | ICD-10-CM | POA: Diagnosis not present

## 2018-06-03 DIAGNOSIS — R197 Diarrhea, unspecified: Secondary | ICD-10-CM | POA: Diagnosis not present

## 2018-06-03 DIAGNOSIS — E039 Hypothyroidism, unspecified: Secondary | ICD-10-CM | POA: Diagnosis not present

## 2018-06-03 DIAGNOSIS — R1030 Lower abdominal pain, unspecified: Secondary | ICD-10-CM

## 2018-06-03 DIAGNOSIS — Z6825 Body mass index (BMI) 25.0-25.9, adult: Secondary | ICD-10-CM | POA: Diagnosis not present

## 2018-06-03 DIAGNOSIS — R1031 Right lower quadrant pain: Secondary | ICD-10-CM | POA: Insufficient documentation

## 2018-06-03 DIAGNOSIS — Z1389 Encounter for screening for other disorder: Secondary | ICD-10-CM | POA: Diagnosis not present

## 2018-06-03 DIAGNOSIS — J329 Chronic sinusitis, unspecified: Secondary | ICD-10-CM | POA: Diagnosis not present

## 2018-06-03 DIAGNOSIS — M25551 Pain in right hip: Secondary | ICD-10-CM | POA: Diagnosis not present

## 2018-06-03 LAB — CBC
HCT: 44.6 % (ref 36.0–46.0)
Hemoglobin: 15 g/dL (ref 12.0–15.0)
MCH: 32.9 pg (ref 26.0–34.0)
MCHC: 33.6 g/dL (ref 30.0–36.0)
MCV: 97.8 fL (ref 80.0–100.0)
NRBC: 0 % (ref 0.0–0.2)
Platelets: 316 10*3/uL (ref 150–400)
RBC: 4.56 MIL/uL (ref 3.87–5.11)
RDW: 11.7 % (ref 11.5–15.5)
WBC: 6 10*3/uL (ref 4.0–10.5)

## 2018-06-03 LAB — COMPREHENSIVE METABOLIC PANEL
ALT: 9 U/L (ref 0–44)
AST: 13 U/L — ABNORMAL LOW (ref 15–41)
Albumin: 3.8 g/dL (ref 3.5–5.0)
Alkaline Phosphatase: 60 U/L (ref 38–126)
Anion gap: 7 (ref 5–15)
BUN: 13 mg/dL (ref 6–20)
CO2: 24 mmol/L (ref 22–32)
Calcium: 8.8 mg/dL — ABNORMAL LOW (ref 8.9–10.3)
Chloride: 107 mmol/L (ref 98–111)
Creatinine, Ser: 0.96 mg/dL (ref 0.44–1.00)
GFR calc Af Amer: >60 mL/min (ref >60)
GFR calc non Af Amer: >60 mL/min (ref >60)
Glucose, Bld: 83 mg/dL (ref 70–99)
Potassium: 3.5 mmol/L (ref 3.5–5.1)
Sodium: 138 mmol/L (ref 135–145)
Total Bilirubin: 0.4 mg/dL (ref 0.3–1.2)
Total Protein: 6.8 g/dL (ref 6.5–8.1)

## 2018-06-03 LAB — URINALYSIS, ROUTINE W REFLEX MICROSCOPIC
Bilirubin Urine: NEGATIVE
Glucose, UA: NEGATIVE mg/dL
Hgb urine dipstick: NEGATIVE
Ketones, ur: NEGATIVE mg/dL
LEUKOCYTES UA: NEGATIVE
Nitrite: NEGATIVE
Protein, ur: NEGATIVE mg/dL
Specific Gravity, Urine: 1.008 (ref 1.005–1.030)
pH: 5 (ref 5.0–8.0)

## 2018-06-03 LAB — DIFFERENTIAL
Basophils Absolute: 0 10*3/uL (ref 0.0–0.1)
Basophils Relative: 1 %
Eosinophils Absolute: 0.1 10*3/uL (ref 0.0–0.5)
Eosinophils Relative: 2 %
Lymphocytes Relative: 34 %
Lymphs Abs: 2 10*3/uL (ref 0.7–4.0)
MONOS PCT: 13 %
Monocytes Absolute: 0.8 10*3/uL (ref 0.1–1.0)
Neutro Abs: 3 10*3/uL (ref 1.7–7.7)
Neutrophils Relative %: 51 %

## 2018-06-03 LAB — LIPASE, BLOOD: LIPASE: 48 U/L (ref 11–51)

## 2018-06-03 MED ORDER — IOPAMIDOL (ISOVUE-300) INJECTION 61%
100.0000 mL | Freq: Once | INTRAVENOUS | Status: AC | PRN
  Administered 2018-06-03: 100 mL via INTRAVENOUS

## 2018-06-03 MED ORDER — SODIUM CHLORIDE 0.9 % IV BOLUS
500.0000 mL | Freq: Once | INTRAVENOUS | Status: AC
  Administered 2018-06-03: 500 mL via INTRAVENOUS

## 2018-06-03 NOTE — ED Provider Notes (Signed)
[facility] EMERGENCY DEPARTMENT Provider Note   CSN: [id number] Arrival date & time: 06/03/18  1503     History   Chief Complaint Chief Complaint  Patient presents with  . Abdominal Pain    HPI Mary Oconnell is a 53 y.o. female.  Patient complains of right lower quadrant pain for last couple days.  She was seen by her provider today and given medicine for a sinus infection but was sent over here to rule out appendicitis  The history is provided by the patient.  Abdominal Pain  Pain location:  Generalized Pain quality: aching   Pain radiation: Right lower quadrant. Pain severity:  Moderate Onset quality:  Sudden Timing:  Constant Progression:  Waxing and waning Chronicity:  New Context: not alcohol use   Associated symptoms: no chest pain, no cough, no diarrhea, no fatigue and no hematuria     Past Medical History:  Diagnosis Date  . Allergy   . Anxiety   . Asthma   . Breast cyst    Right-cyst was drained in March 2014  . Burning with urination 01/11/2015  . BV (bacterial vaginosis) 09/30/2015  . Cold sore 06/19/2014  . Constipated 05/27/2013  . Current use of estrogen therapy 09/21/2015  . Fibroid   . Fibromyalgia   . Hematuria 01/11/2015  . History of cold sores   . Hypothyroid 06/26/2013   Pt states does not have hypothyroid  . IBS (irritable bowel syndrome)   . Pain, abdominal, RLQ 06/17/2013  . RLQ abdominal pain 06/01/2014  . Urinary frequency 06/17/2013  . Vaginal discharge 09/30/2015  . Vaginal pain 09/30/2015  . Vertigo   . Vitamin D deficiency   . Yeast infection 05/27/2013    Patient Active Problem List   Diagnosis Date Noted  . Screening for colorectal cancer 12/05/2017  . Encounter for well woman exam with routine gynecological exam 12/05/2017  . Encounter for screening colonoscopy 02/28/2017  . BV (bacterial vaginosis) 09/30/2015  . Current use of estrogen therapy 09/21/2015  . Cold sore 06/19/2014  . Hypothyroid 06/26/2013  . OA (osteoarthritis)  of finger 06/17/2013  . Constipation 05/27/2013  . Breast mass 08/26/2012  . Anxiety 08/26/2012  . H/O estrogen therapy 08/26/2012    Past Surgical History:  Procedure Laterality Date  . ABDOMINAL HYSTERECTOMY    . BIOPSY  04/11/2017   Procedure: BIOPSY;  Surgeon: Daneil Dolin, MD;  Location: [facility] SU[facility];  Service: Endoscopy;;  duodenal  . CARPAL TUNNEL RELEASE Left 12/31/2012   Procedure: LEFT CARPAL TUNNEL RELEASE;  Surgeon: Sanjuana Kava, MD;  Location: [facility] ORS;  Service: Orthopedics;  Laterality: Left;  . CHOLECYSTECTOMY N/A 04/18/2013   Procedure: LAPAROSCOPIC CHOLECYSTECTOMY;  Surgeon: Jamesetta So, MD;  Location: [facility] ORS;  Service: General;  Laterality: N/A;  . COLONOSCOPY WITH PROPOFOL N/A 04/11/2017   Procedure: COLONOSCOPY WITH PROPOFOL;  Surgeon: Daneil Dolin, MD;  Location: [facility];  Service: Endoscopy;  Laterality: N/A;  12:00pm  . ESOPHAGOGASTRODUODENOSCOPY (EGD) WITH PROPOFOL N/A 04/11/2017   Procedure: ESOPHAGOGASTRODUODENOSCOPY (EGD) WITH PROPOFOL;  Surgeon: Daneil Dolin, MD;  Location: [facility];  Service: Endoscopy;  Laterality: N/A;  . Venia Minks DILATION N/A 04/11/2017   Procedure: Venia Minks DILATION;  Surgeon: Daneil Dolin, MD;  Location: [facility];  Service: Endoscopy;  Laterality: N/A;  . TONSILLECTOMY    . TUBAL LIGATION       OB History    Gravida  3   Para  3   Term  3   Preterm      AB      Living  3     SAB      TAB      Ectopic      Multiple      Live Births  3            Home Medications    Prior to Admission medications   Medication Sig Start Date End Date Taking? Authorizing Provider  albuterol (PROVENTIL HFA;VENTOLIN HFA) 108 (90 Base) MCG/ACT inhaler Inhale 2 puffs into the lungs every 6 (six) hours as needed for wheezing or shortness of breath. Patient taking differently: Inhale 1 puff every 6 (six) hours as needed into the lungs for wheezing or shortness of breath.  09/21/15  Yes Derrek Monaco A,  NP  Ascorbic Acid (VITAMIN C GUMMIES PO) Take 2 each by mouth every morning.   Yes [provider]  Cholecalciferol (VITAMIN D3 PO) Take 0.5 mLs daily by mouth.   Yes [provider]  Cyanocobalamin ([employer] B12 GUMMIES PO) Take 2 each by mouth every morning.   Yes [provider]  estradiol (MINIVELLE) 0.1 MG/24HR patch APPLY 1 PATCH TWICE WEEKLY Patient taking differently: Place 1 patch onto the skin 2 (two) times a week. Thursdays and Sundays 12/05/17  Yes Derrek Monaco A, NP  fluticasone (FLONASE) 50 MCG/ACT nasal spray Place 1 spray into both nostrils every morning.    Yes [provider]  ibuprofen (ADVIL,MOTRIN) 800 MG tablet Take 800 mg by mouth every 8 (eight) hours as needed for moderate pain.   Yes [provider]  LORazepam (ATIVAN) 0.5 MG tablet Take 1 tablet (0.5 mg total) by mouth 3 (three) times daily. Patient taking differently: Take 0.5 mg daily as needed by mouth for anxiety.  09/18/13  Yes Florian Buff, MD  pantoprazole (PROTONIX) 40 MG tablet Take 40 mg by mouth every morning. 04/15/18  Yes [provider]  valACYclovir (VALTREX) 1000 MG tablet Take 1 daily Patient taking differently: Take 500 mg by mouth daily.  12/05/17  Yes Derrek Monaco A, NP  azithromycin (ZITHROMAX) 250 MG tablet Take 250-500 mg by mouth See admin instructions. Take two tablets on day 1, then take one tablet on days 2 through 5. Starting/prescribed on 06/03/2018 06/03/18   [provider]  predniSONE (DELTASONE) 5 MG tablet Take 5 mg by mouth as directed. 42 tablets to take as directed prescribed on 06/03/2018 06/03/18   [provider]    Family History Family History  Problem Relation Age of Onset  . Thyroid disease Mother   . Cancer Father        lung  . Cancer Maternal Grandmother 40       breast   . Colon cancer Maternal Grandfather   . Crohn's disease Sister     Social History Social History   Tobacco Use  .  Smoking status: Former Smoker    Packs/day: 0.50    Years: 5.00    Pack years: 2.50    Types: Cigarettes  . Smokeless tobacco: Never Used  . Tobacco comment: Quit in 1996  Substance Use Topics  . Alcohol use: Not Currently    Comment: occ wine  . Drug use: No     Allergies   Shellfish allergy; Almond (diagnostic); Ciprofloxacin; Other; Septra [sulfamethoxazole-trimethoprim]; Latex; and Synthroid [levothyroxine sodium]   Review of Systems Review of Systems  Constitutional: Negative for appetite change and fatigue.  HENT: Negative for congestion,  ear discharge and sinus pressure.   Eyes: Negative for discharge.  Respiratory: Negative for cough.   Cardiovascular: Negative for chest pain.  Gastrointestinal: Positive for abdominal pain. Negative for diarrhea.  Genitourinary: Negative for frequency and hematuria.  Musculoskeletal: Negative for back pain.  Skin: Negative for rash.  Neurological: Negative for seizures and headaches.  Psychiatric/Behavioral: Negative for hallucinations.     Physical Exam Updated Vital Signs BP (!) 138/50 (BP Location: Right Arm)   Pulse 77   Temp 97.9 F (36.6 C) (Oral)   Ht 5\' 5"  (1.651 m)   Wt 71.7 kg   SpO2 100%   BMI 26.29 kg/m   Physical Exam Vitals signs and nursing note reviewed.  Constitutional:      Appearance: She is well-developed.  HENT:     Head: Normocephalic.     Nose: Nose normal.  Eyes:     General: No scleral icterus.    Conjunctiva/sclera: Conjunctivae normal.  Neck:     Musculoskeletal: Neck supple.     Thyroid: No thyromegaly.  Cardiovascular:     Rate and Rhythm: Normal rate and regular rhythm.     Heart sounds: No murmur. No friction rub. No gallop.   Pulmonary:     Breath sounds: No stridor. No wheezing or rales.  Chest:     Chest wall: No tenderness.  Abdominal:     General: There is no distension.     Tenderness: There is abdominal tenderness. There is no rebound.     Comments: Tenderness right  lower quadrant  Musculoskeletal: Normal range of motion.  Lymphadenopathy:     Cervical: No cervical adenopathy.  Skin:    Findings: No erythema or rash.  Neurological:     Mental Status: She is oriented to person, place, and time.     Motor: No abnormal muscle tone.     Coordination: Coordination normal.  Psychiatric:        Behavior: Behavior normal.      ED Treatments / Results  Labs (all labs ordered are listed, but only abnormal results are displayed) Labs Reviewed  COMPREHENSIVE METABOLIC PANEL - Abnormal; Notable for the following components:      Result Value   Calcium 8.8 (*)    AST 13 (*)    All other components within normal limits  URINALYSIS, ROUTINE W REFLEX MICROSCOPIC - Abnormal; Notable for the following components:   Color, Urine STRAW (*)    All other components within normal limits  LIPASE, BLOOD  CBC  DIFFERENTIAL    EKG None  Radiology Ct Abdomen Pelvis W Contrast  Result Date: 06/03/2018 CLINICAL DATA:  RIGHT lower quadrant pain with diarrhea and nausea for 1 day, history of cholecystectomy, hysterectomy, irritable bowel syndrome, asthma, former smoker EXAM: CT ABDOMEN AND PELVIS WITH CONTRAST TECHNIQUE: Multidetector CT imaging of the abdomen and pelvis was performed using the standard protocol following bolus administration of intravenous contrast. Sagittal and coronal MPR images reconstructed from axial data set. CONTRAST:  137mL ISOVUE-300 IOPAMIDOL (ISOVUE-300) INJECTION 61% IV. No oral contrast. COMPARISON:  None FINDINGS: Lower chest: Minimal dependent atelectasis at lung bases Hepatobiliary: Gallbladder surgically absent. Minimal focal fatty infiltration of liver adjacent to falciform fissure. Tiny hepatic cysts. No additional hepatic mass lesions. Pancreas: Normal appearance Spleen: Normal appearance Adrenals/Urinary Tract: Adrenal glands normal appearance. Small BILATERAL renal cysts. Tiny nonobstructing calculus upper pole RIGHT kidney.  Kidneys, bladder and ureters otherwise unremarkable. Stomach/Bowel: Normal appendix. Stomach and bowel loops normal appearance. Vascular/Lymphatic: Scattered pelvic phleboliths. Aorta  normal caliber. Vascular structures patent. No adenopathy. Reproductive: Uterus surgically absent normal sized ovaries. Other: No free air or free fluid. No hernia or acute inflammatory process. Musculoskeletal: RIGHT spondylolysis L5 without spondylolisthesis. No additional osseous findings. IMPRESSION: Small BILATERAL renal cysts with tiny nonobstructing calculus at upper pole of RIGHT kidney. No acute intra-abdominal or intrapelvic abnormalities. Electronically Signed   By: Lavonia Dana M.D.   On: 06/03/2018 17:22    Procedures Procedures (including critical care time)  Medications Ordered in ED Medications  sodium chloride 0.9 % bolus 500 mL (500 mLs Intravenous New Bag/Given 06/03/18 1616)  iopamidol (ISOVUE-300) 61 % injection 100 mL (100 mLs Intravenous Contrast Given 06/03/18 1656)     Initial Impression / Assessment and Plan / ED Course  I have reviewed the triage vital signs and the nursing notes.  Pertinent labs & imaging results that were available during my care of the patient were reviewed by me and considered in my medical decision making (see chart for details).     Patient with mild to moderate right lower quadrant tenderness.  CT scan unremarkable.  Patient is being treated for sinus infection starting today.  She has a history of irritable bowel.  Suspect her irritable bowel acting up and she will follow-up with her PCP  Final Clinical Impressions(s) / ED Diagnoses   Final diagnoses:  Lower abdominal pain    ED Discharge Orders    None       Milton Ferguson, MD 06/03/18 1800

## 2018-06-03 NOTE — ED Triage Notes (Signed)
Pt reports right lower quad pain that radiates to umbilicus. Pain started last night and reports multiple episodes of diarrhea. Pain is intermittent. Denies urinary symptoms. Pt went to PCP and was instructed to come to ED

## 2018-06-03 NOTE — Discharge Instructions (Addendum)
Take Tylenol for pain and follow-up with your family doctor if not improving 

## 2018-06-13 ENCOUNTER — Ambulatory Visit: Payer: BLUE CROSS/BLUE SHIELD | Admitting: Adult Health

## 2018-06-19 ENCOUNTER — Ambulatory Visit (HOSPITAL_COMMUNITY)
Admission: RE | Admit: 2018-06-19 | Discharge: 2018-06-19 | Disposition: A | Discharge status: Home / Self Care | Payer: BLUE CROSS/BLUE SHIELD | Source: Ambulatory Visit | Attending: Physician Assistant | Admitting: Physician Assistant

## 2018-06-19 ENCOUNTER — Other Ambulatory Visit (HOSPITAL_COMMUNITY): Payer: Self-pay | Admitting: Physician Assistant

## 2018-06-19 DIAGNOSIS — R059 Cough, unspecified: Secondary | ICD-10-CM

## 2018-06-19 DIAGNOSIS — R05 Cough: Secondary | ICD-10-CM | POA: Diagnosis not present

## 2018-06-19 DIAGNOSIS — R5383 Other fatigue: Secondary | ICD-10-CM | POA: Diagnosis not present

## 2018-06-19 DIAGNOSIS — Z6826 Body mass index (BMI) 26.0-26.9, adult: Secondary | ICD-10-CM | POA: Diagnosis not present

## 2018-06-19 DIAGNOSIS — H9190 Unspecified hearing loss, unspecified ear: Secondary | ICD-10-CM | POA: Diagnosis not present

## 2018-06-19 DIAGNOSIS — R42 Dizziness and giddiness: Secondary | ICD-10-CM | POA: Diagnosis not present

## 2018-06-19 DIAGNOSIS — Z1389 Encounter for screening for other disorder: Secondary | ICD-10-CM | POA: Diagnosis not present

## 2018-07-05 ENCOUNTER — Other Ambulatory Visit: Payer: Self-pay | Admitting: Physician Assistant

## 2018-07-05 ENCOUNTER — Other Ambulatory Visit (HOSPITAL_COMMUNITY): Payer: Self-pay | Admitting: Physician Assistant

## 2018-07-05 DIAGNOSIS — R42 Dizziness and giddiness: Secondary | ICD-10-CM

## 2018-07-05 DIAGNOSIS — H918X9 Other specified hearing loss, unspecified ear: Secondary | ICD-10-CM

## 2018-07-08 ENCOUNTER — Telehealth (HOSPITAL_COMMUNITY): Payer: Self-pay

## 2018-07-09 DIAGNOSIS — E663 Overweight: Secondary | ICD-10-CM | POA: Diagnosis not present

## 2018-07-09 DIAGNOSIS — K219 Gastro-esophageal reflux disease without esophagitis: Secondary | ICD-10-CM | POA: Diagnosis not present

## 2018-07-09 DIAGNOSIS — L049 Acute lymphadenitis, unspecified: Secondary | ICD-10-CM | POA: Diagnosis not present

## 2018-07-09 DIAGNOSIS — Z1389 Encounter for screening for other disorder: Secondary | ICD-10-CM | POA: Diagnosis not present

## 2018-07-09 DIAGNOSIS — Z6827 Body mass index (BMI) 27.0-27.9, adult: Secondary | ICD-10-CM | POA: Diagnosis not present

## 2018-07-09 DIAGNOSIS — B009 Herpesviral infection, unspecified: Secondary | ICD-10-CM | POA: Diagnosis not present

## 2018-07-12 ENCOUNTER — Ambulatory Visit: Payer: BLUE CROSS/BLUE SHIELD

## 2018-07-18 ENCOUNTER — Other Ambulatory Visit: Payer: Self-pay

## 2018-07-18 ENCOUNTER — Ambulatory Visit
Admission: RE | Admit: 2018-07-18 | Discharge: 2018-07-18 | Disposition: A | Discharge status: Home / Self Care | Payer: BLUE CROSS/BLUE SHIELD | Source: Ambulatory Visit | Attending: Physician Assistant | Admitting: Physician Assistant

## 2018-07-18 DIAGNOSIS — R42 Dizziness and giddiness: Secondary | ICD-10-CM | POA: Diagnosis not present

## 2018-07-18 DIAGNOSIS — H918X9 Other specified hearing loss, unspecified ear: Secondary | ICD-10-CM | POA: Insufficient documentation

## 2018-07-18 DIAGNOSIS — H9190 Unspecified hearing loss, unspecified ear: Secondary | ICD-10-CM | POA: Diagnosis not present

## 2018-09-05 DIAGNOSIS — Z6827 Body mass index (BMI) 27.0-27.9, adult: Secondary | ICD-10-CM | POA: Diagnosis not present

## 2018-09-05 DIAGNOSIS — E663 Overweight: Secondary | ICD-10-CM | POA: Diagnosis not present

## 2018-09-05 DIAGNOSIS — F419 Anxiety disorder, unspecified: Secondary | ICD-10-CM | POA: Diagnosis not present

## 2018-10-24 DIAGNOSIS — E663 Overweight: Secondary | ICD-10-CM | POA: Diagnosis not present

## 2018-10-24 DIAGNOSIS — J069 Acute upper respiratory infection, unspecified: Secondary | ICD-10-CM | POA: Diagnosis not present

## 2018-10-24 DIAGNOSIS — Z6827 Body mass index (BMI) 27.0-27.9, adult: Secondary | ICD-10-CM | POA: Diagnosis not present

## 2018-10-24 DIAGNOSIS — Z20828 Contact with and (suspected) exposure to other viral communicable diseases: Secondary | ICD-10-CM | POA: Diagnosis not present

## 2018-12-03 DIAGNOSIS — J019 Acute sinusitis, unspecified: Secondary | ICD-10-CM | POA: Diagnosis not present

## 2018-12-21 ENCOUNTER — Other Ambulatory Visit: Payer: Self-pay | Admitting: Adult Health

## 2019-01-10 ENCOUNTER — Other Ambulatory Visit: Payer: Self-pay

## 2019-01-10 DIAGNOSIS — J069 Acute upper respiratory infection, unspecified: Secondary | ICD-10-CM | POA: Diagnosis not present

## 2019-01-10 DIAGNOSIS — R6889 Other general symptoms and signs: Secondary | ICD-10-CM | POA: Diagnosis not present

## 2019-01-10 DIAGNOSIS — Z20822 Contact with and (suspected) exposure to covid-19: Secondary | ICD-10-CM

## 2019-01-10 DIAGNOSIS — Z681 Body mass index (BMI) 19 or less, adult: Secondary | ICD-10-CM | POA: Diagnosis not present

## 2019-01-11 LAB — NOVEL CORONAVIRUS, NAA: SARS-CoV-2, NAA: NOT DETECTED

## 2019-02-06 ENCOUNTER — Other Ambulatory Visit: Payer: Self-pay | Admitting: *Deleted

## 2019-02-06 DIAGNOSIS — H8111 Benign paroxysmal vertigo, right ear: Secondary | ICD-10-CM | POA: Diagnosis not present

## 2019-02-06 DIAGNOSIS — E663 Overweight: Secondary | ICD-10-CM | POA: Diagnosis not present

## 2019-02-06 DIAGNOSIS — R42 Dizziness and giddiness: Secondary | ICD-10-CM | POA: Diagnosis not present

## 2019-02-06 DIAGNOSIS — R6889 Other general symptoms and signs: Secondary | ICD-10-CM | POA: Diagnosis not present

## 2019-02-06 DIAGNOSIS — H6501 Acute serous otitis media, right ear: Secondary | ICD-10-CM | POA: Diagnosis not present

## 2019-02-06 DIAGNOSIS — Z20822 Contact with and (suspected) exposure to covid-19: Secondary | ICD-10-CM

## 2019-02-06 DIAGNOSIS — J309 Allergic rhinitis, unspecified: Secondary | ICD-10-CM | POA: Diagnosis not present

## 2019-02-06 DIAGNOSIS — K219 Gastro-esophageal reflux disease without esophagitis: Secondary | ICD-10-CM | POA: Diagnosis not present

## 2019-02-06 DIAGNOSIS — H6991 Unspecified Eustachian tube disorder, right ear: Secondary | ICD-10-CM | POA: Diagnosis not present

## 2019-02-06 DIAGNOSIS — Z6828 Body mass index (BMI) 28.0-28.9, adult: Secondary | ICD-10-CM | POA: Diagnosis not present

## 2019-02-07 LAB — NOVEL CORONAVIRUS, NAA: SARS-CoV-2, NAA: NOT DETECTED

## 2019-02-20 ENCOUNTER — Telehealth: Payer: Self-pay

## 2019-02-20 DIAGNOSIS — Z7189 Other specified counseling: Secondary | ICD-10-CM

## 2019-02-20 NOTE — Telephone Encounter (Signed)
Received call from patient stating that she received a text message from her hair stylist stating that she tested positive for COVID-19. Date of last contact was 10/06; she denies any symptoms at this time and she has been advised to isolate at home and test on 10/12 or 10/13 for best results.

## 2019-02-21 ENCOUNTER — Other Ambulatory Visit: Payer: Self-pay

## 2019-02-21 DIAGNOSIS — Z20828 Contact with and (suspected) exposure to other viral communicable diseases: Secondary | ICD-10-CM | POA: Diagnosis not present

## 2019-02-21 DIAGNOSIS — Z20822 Contact with and (suspected) exposure to covid-19: Secondary | ICD-10-CM

## 2019-02-23 LAB — NOVEL CORONAVIRUS, NAA: SARS-CoV-2, NAA: NOT DETECTED

## 2019-02-25 ENCOUNTER — Other Ambulatory Visit: Payer: Self-pay

## 2019-02-25 DIAGNOSIS — Z20822 Contact with and (suspected) exposure to covid-19: Secondary | ICD-10-CM

## 2019-02-25 DIAGNOSIS — Z20828 Contact with and (suspected) exposure to other viral communicable diseases: Secondary | ICD-10-CM | POA: Diagnosis not present

## 2019-02-27 DIAGNOSIS — J3089 Other allergic rhinitis: Secondary | ICD-10-CM | POA: Diagnosis not present

## 2019-02-27 DIAGNOSIS — E663 Overweight: Secondary | ICD-10-CM | POA: Diagnosis not present

## 2019-02-27 DIAGNOSIS — Z6828 Body mass index (BMI) 28.0-28.9, adult: Secondary | ICD-10-CM | POA: Diagnosis not present

## 2019-02-27 LAB — NOVEL CORONAVIRUS, NAA: SARS-CoV-2, NAA: NOT DETECTED

## 2019-03-19 ENCOUNTER — Other Ambulatory Visit: Payer: Self-pay | Admitting: Adult Health

## 2019-04-09 ENCOUNTER — Other Ambulatory Visit: Payer: Self-pay

## 2019-04-09 ENCOUNTER — Encounter: Payer: Self-pay | Admitting: Adult Health

## 2019-04-09 ENCOUNTER — Ambulatory Visit (INDEPENDENT_AMBULATORY_CARE_PROVIDER_SITE_OTHER): Payer: BC Managed Care – PPO | Admitting: Adult Health

## 2019-04-09 VITALS — BP 101/57 | HR 68 | Ht 65.0 in | Wt 173.0 lb

## 2019-04-09 DIAGNOSIS — Z1212 Encounter for screening for malignant neoplasm of rectum: Secondary | ICD-10-CM

## 2019-04-09 DIAGNOSIS — Z01419 Encounter for gynecological examination (general) (routine) without abnormal findings: Secondary | ICD-10-CM | POA: Diagnosis not present

## 2019-04-09 DIAGNOSIS — Z1211 Encounter for screening for malignant neoplasm of colon: Secondary | ICD-10-CM | POA: Diagnosis not present

## 2019-04-09 DIAGNOSIS — Z1321 Encounter for screening for nutritional disorder: Secondary | ICD-10-CM | POA: Insufficient documentation

## 2019-04-09 DIAGNOSIS — Z20822 Contact with and (suspected) exposure to covid-19: Secondary | ICD-10-CM

## 2019-04-09 DIAGNOSIS — R1031 Right lower quadrant pain: Secondary | ICD-10-CM

## 2019-04-09 DIAGNOSIS — Z113 Encounter for screening for infections with a predominantly sexual mode of transmission: Secondary | ICD-10-CM | POA: Insufficient documentation

## 2019-04-09 DIAGNOSIS — Z79899 Other long term (current) drug therapy: Secondary | ICD-10-CM

## 2019-04-09 LAB — HEMOCCULT GUIAC POC 1CARD (OFFICE): Fecal Occult Blood, POC: NEGATIVE

## 2019-04-09 MED ORDER — ESTRADIOL 0.1 MG/24HR TD PTTW: MEDICATED_PATCH | TRANSDERMAL | 4 refills | Status: DC

## 2019-04-09 MED ORDER — VALACYCLOVIR HCL 1 G PO TABS: ORAL_TABLET | ORAL | 12 refills | Status: DC

## 2019-04-09 NOTE — Progress Notes (Signed)
Patient ID: Mary Oconnell, female   DOB: Jun 21, 1965, 53 y.o.   MRN: [id number] History of Present Illness: Mary Oconnell is a 53 year old white female, divorced, sp hysterectomy in for well woman gyn exam. [employer], MD is PCP   Current Medications, Allergies, Past Medical History, Past Surgical History, Family History and Social History were reviewed in [employer] record.     Review of Systems: Patient denies any headaches, hearing loss, fatigue, blurred vision, shortness of breath, chest pain, problems with bowel movements, urination, or intercourse(not currently). No joint pain or mood swings. Has RLQ pain, its chronic, but has pain monthly. Occasioanl  Hot flashes    Physical Exam:BP (!) 101/57 (BP Location: Left Arm, Patient Position: Sitting, Cuff Size: Normal)   Pulse 68   Ht 5\' 5"  (1.651 m)   Wt 173 lb (78.5 kg)   BMI 28.79 kg/m  General:  Well developed, well nourished, no acute distress Skin:  Warm and dry,tan, several tattoos Neck:  Midline trachea, normal thyroid, good ROM, no lymphadenopathy Lungs; Clear to auscultation bilaterally Breast:  No dominant palpable mass, retraction, or nipple discharge Cardiovascular: Regular rate and rhythm Abdomen:  Soft, non tender, no hepatosplenomegaly Pelvic:  External genitalia is normal in appearance, no lesions.  The vagina is normal in appearance. Urethra has no lesions or masses. The cervix and uterus are absent.  No adnexal masses, RLQ tenderness noted.Bladder is non tender, no masses felt. Rectal: Good sphincter tone, no polyps, or hemorrhoids felt.  Hemoccult negative. Extremities/musculoskeletal:  No swelling,+ varicosities noted, no clubbing or cyanosis Psych:  No mood changes, alert and cooperative,seems happy Fall risk is low PHQ 9 score is 0 Co exam with Weyman Croon FNP.  Impression and Plan  1. Encounter for well woman exam with routine gynecological exam Physical in 1 year  Get mammogram  Check  CBC,CMP,TSH and lipids(orders given to get fasting)  2. Current use of estrogen therapy Meds ordered this encounter  Medications  . valACYclovir (VALTREX) 1000 MG tablet    Sig: TAKE 1 TABLET BY MOUTH DAILY    Dispense:  30 tablet    Refill:  12    Order Specific Question:   Supervising Provider    Answer:   Elonda Husky, LUTHER H [[id number]]  . estradiol (VIVELLE-DOT) 0.1 MG/24HR patch    Sig: Use 1 patch twice a week    Dispense:  24 patch    Refill:  4    Order Specific Question:   Supervising Provider    Answer:   Elonda Husky, LUTHER H [[id number]]    3. Screening for colorectal cancer Colonoscopy per GI  4. RLQ abdominal pain Get GYN [place] in 2 weeks   5. Encounter for vitamin deficiency screening Check vitamin D  6. Screening examination for STD (sexually transmitted disease) Check HIV and RPR

## 2019-04-10 LAB — NOVEL CORONAVIRUS, NAA: SARS-CoV-2, NAA: NOT DETECTED

## 2019-04-21 ENCOUNTER — Other Ambulatory Visit: Payer: BC Managed Care – PPO

## 2019-04-21 DIAGNOSIS — Z01419 Encounter for gynecological examination (general) (routine) without abnormal findings: Secondary | ICD-10-CM | POA: Diagnosis not present

## 2019-04-21 DIAGNOSIS — Z1321 Encounter for screening for nutritional disorder: Secondary | ICD-10-CM | POA: Diagnosis not present

## 2019-04-21 DIAGNOSIS — Z113 Encounter for screening for infections with a predominantly sexual mode of transmission: Secondary | ICD-10-CM | POA: Diagnosis not present

## 2019-04-22 ENCOUNTER — Other Ambulatory Visit: Payer: Self-pay | Admitting: Adult Health

## 2019-04-22 DIAGNOSIS — R7989 Other specified abnormal findings of blood chemistry: Secondary | ICD-10-CM

## 2019-04-22 LAB — CBC
Hematocrit: 43.8 % (ref 34.0–46.6)
Hemoglobin: 15.3 g/dL (ref 11.1–15.9)
MCH: 32.6 pg (ref 26.6–33.0)
MCHC: 34.9 g/dL (ref 31.5–35.7)
MCV: 93 fL (ref 79–97)
Platelets: 320 10*3/uL (ref 150–450)
RBC: 4.69 x10E6/uL (ref 3.77–5.28)
RDW: 12.6 % (ref 11.7–15.4)
WBC: 4.9 10*3/uL (ref 3.4–10.8)

## 2019-04-22 LAB — LIPID PANEL
Chol/HDL Ratio: 2.4 ratio (ref 0.0–4.4)
Cholesterol, Total: 171 mg/dL (ref 100–199)
HDL: 70 mg/dL (ref >39)
LDL Chol Calc (NIH): 90 mg/dL (ref 0–99)
Triglycerides: 55 mg/dL (ref 0–149)
VLDL Cholesterol Cal: 11 mg/dL (ref 5–40)

## 2019-04-22 LAB — COMPREHENSIVE METABOLIC PANEL
ALT: 6 IU/L (ref 0–32)
AST: 12 IU/L (ref 0–40)
Albumin/Globulin Ratio: 1.8 (ref 1.2–2.2)
Albumin: 4.4 g/dL (ref 3.8–4.9)
Alkaline Phosphatase: 81 IU/L (ref 39–117)
BUN/Creatinine Ratio: 15 (ref 9–23)
BUN: 16 mg/dL (ref 6–24)
Bilirubin Total: 0.4 mg/dL (ref 0.0–1.2)
CO2: 25 mmol/L (ref 20–29)
Calcium: 9.7 mg/dL (ref 8.7–10.2)
Chloride: 103 mmol/L (ref 96–106)
Creatinine, Ser: 1.1 mg/dL — ABNORMAL HIGH (ref 0.57–1.00)
GFR calc Af Amer: 67 mL/min/{1.73_m2} (ref >59)
GFR calc non Af Amer: 58 mL/min/{1.73_m2} — ABNORMAL LOW (ref >59)
Globulin, Total: 2.4 g/dL (ref 1.5–4.5)
Glucose: 85 mg/dL (ref 65–99)
Potassium: 4.8 mmol/L (ref 3.5–5.2)
Sodium: 142 mmol/L (ref 134–144)
Total Protein: 6.8 g/dL (ref 6.0–8.5)

## 2019-04-22 LAB — VITAMIN D 25 HYDROXY (VIT D DEFICIENCY, FRACTURES): Vit D, 25-Hydroxy: 31.3 ng/mL (ref 30.0–100.0)

## 2019-04-22 LAB — TSH: TSH: 4.58 u[IU]/mL — ABNORMAL HIGH (ref 0.450–4.500)

## 2019-04-22 LAB — RPR: RPR Ser Ql: NONREACTIVE

## 2019-04-22 LAB — HIV ANTIBODY (ROUTINE TESTING W REFLEX): HIV Screen 4th Generation wRfx: NONREACTIVE

## 2019-04-22 NOTE — Progress Notes (Signed)
Ck TSH and CMP in about 8 weeks

## 2019-04-23 ENCOUNTER — Other Ambulatory Visit: Payer: BC Managed Care – PPO

## 2019-04-23 ENCOUNTER — Other Ambulatory Visit: Payer: Self-pay

## 2019-04-23 ENCOUNTER — Ambulatory Visit (INDEPENDENT_AMBULATORY_CARE_PROVIDER_SITE_OTHER): Payer: BC Managed Care – PPO

## 2019-04-23 DIAGNOSIS — R1031 Right lower quadrant pain: Secondary | ICD-10-CM

## 2019-04-23 NOTE — Progress Notes (Signed)
PELVIC US TA/TV:normal vaginal cuff,normal ovaries bilat,left simple paraovarian cyst 1 x .8 x .8 cm,no free fluid,ovaries appear mobile,left adnexal pain during ultrasound

## 2019-05-17 ENCOUNTER — Other Ambulatory Visit: Payer: Self-pay | Admitting: Adult Health

## 2019-06-05 ENCOUNTER — Ambulatory Visit: Payer: BC Managed Care – PPO | Attending: Internal Medicine

## 2019-06-05 DIAGNOSIS — Z20822 Contact with and (suspected) exposure to covid-19: Secondary | ICD-10-CM | POA: Diagnosis not present

## 2019-06-06 LAB — NOVEL CORONAVIRUS, NAA: SARS-CoV-2, NAA: NOT DETECTED

## 2019-08-18 ENCOUNTER — Other Ambulatory Visit: Payer: Self-pay | Admitting: Adult Health

## 2019-09-15 DIAGNOSIS — Z1389 Encounter for screening for other disorder: Secondary | ICD-10-CM | POA: Diagnosis not present

## 2019-09-15 DIAGNOSIS — E663 Overweight: Secondary | ICD-10-CM | POA: Diagnosis not present

## 2019-09-15 DIAGNOSIS — J069 Acute upper respiratory infection, unspecified: Secondary | ICD-10-CM | POA: Diagnosis not present

## 2019-09-15 DIAGNOSIS — R946 Abnormal results of thyroid function studies: Secondary | ICD-10-CM | POA: Diagnosis not present

## 2019-09-15 DIAGNOSIS — Z6829 Body mass index (BMI) 29.0-29.9, adult: Secondary | ICD-10-CM | POA: Diagnosis not present

## 2019-09-23 ENCOUNTER — Other Ambulatory Visit: Payer: Self-pay | Admitting: Family Medicine

## 2019-09-23 DIAGNOSIS — Z1231 Encounter for screening mammogram for malignant neoplasm of breast: Secondary | ICD-10-CM

## 2019-10-29 DIAGNOSIS — E663 Overweight: Secondary | ICD-10-CM | POA: Diagnosis not present

## 2019-10-29 DIAGNOSIS — J309 Allergic rhinitis, unspecified: Secondary | ICD-10-CM | POA: Diagnosis not present

## 2019-10-29 DIAGNOSIS — F419 Anxiety disorder, unspecified: Secondary | ICD-10-CM | POA: Diagnosis not present

## 2019-10-29 DIAGNOSIS — J45909 Unspecified asthma, uncomplicated: Secondary | ICD-10-CM | POA: Diagnosis not present

## 2019-10-29 DIAGNOSIS — Z6828 Body mass index (BMI) 28.0-28.9, adult: Secondary | ICD-10-CM | POA: Diagnosis not present

## 2019-11-11 ENCOUNTER — Ambulatory Visit (HOSPITAL_COMMUNITY)
Admission: RE | Admit: 2019-11-11 | Discharge: 2019-11-11 | Disposition: A | Discharge status: Home / Self Care | Payer: BC Managed Care – PPO | Source: Ambulatory Visit | Attending: Physician Assistant | Admitting: Physician Assistant

## 2019-11-11 ENCOUNTER — Other Ambulatory Visit: Payer: Self-pay

## 2019-11-11 ENCOUNTER — Other Ambulatory Visit (HOSPITAL_COMMUNITY): Payer: Self-pay | Admitting: Physician Assistant

## 2019-11-11 ENCOUNTER — Other Ambulatory Visit: Payer: Self-pay | Admitting: Physician Assistant

## 2019-11-11 DIAGNOSIS — M5416 Radiculopathy, lumbar region: Secondary | ICD-10-CM | POA: Diagnosis not present

## 2019-11-11 DIAGNOSIS — M79662 Pain in left lower leg: Secondary | ICD-10-CM

## 2019-11-11 DIAGNOSIS — R262 Difficulty in walking, not elsewhere classified: Secondary | ICD-10-CM | POA: Diagnosis not present

## 2019-11-11 DIAGNOSIS — M25572 Pain in left ankle and joints of left foot: Secondary | ICD-10-CM | POA: Diagnosis not present

## 2019-11-11 DIAGNOSIS — Q7649 Other congenital malformations of spine, not associated with scoliosis: Secondary | ICD-10-CM | POA: Diagnosis not present

## 2019-11-11 DIAGNOSIS — M4726 Other spondylosis with radiculopathy, lumbar region: Secondary | ICD-10-CM | POA: Diagnosis not present

## 2019-11-11 DIAGNOSIS — M7732 Calcaneal spur, left foot: Secondary | ICD-10-CM | POA: Diagnosis not present

## 2019-11-11 DIAGNOSIS — M79605 Pain in left leg: Secondary | ICD-10-CM | POA: Diagnosis not present

## 2019-11-11 DIAGNOSIS — Z6828 Body mass index (BMI) 28.0-28.9, adult: Secondary | ICD-10-CM | POA: Diagnosis not present

## 2019-12-17 DIAGNOSIS — Z1389 Encounter for screening for other disorder: Secondary | ICD-10-CM | POA: Diagnosis not present

## 2019-12-17 DIAGNOSIS — M1991 Primary osteoarthritis, unspecified site: Secondary | ICD-10-CM | POA: Diagnosis not present

## 2019-12-17 DIAGNOSIS — K5289 Other specified noninfective gastroenteritis and colitis: Secondary | ICD-10-CM | POA: Diagnosis not present

## 2019-12-17 DIAGNOSIS — R197 Diarrhea, unspecified: Secondary | ICD-10-CM | POA: Diagnosis not present

## 2019-12-17 DIAGNOSIS — E663 Overweight: Secondary | ICD-10-CM | POA: Diagnosis not present

## 2019-12-17 DIAGNOSIS — Z6829 Body mass index (BMI) 29.0-29.9, adult: Secondary | ICD-10-CM | POA: Diagnosis not present

## 2019-12-19 ENCOUNTER — Other Ambulatory Visit (HOSPITAL_COMMUNITY): Payer: Self-pay | Admitting: Internal Medicine

## 2019-12-19 ENCOUNTER — Ambulatory Visit (HOSPITAL_COMMUNITY)
Admission: RE | Admit: 2019-12-19 | Discharge: 2019-12-19 | Disposition: A | Discharge status: Home / Self Care | Payer: BC Managed Care – PPO | Source: Ambulatory Visit | Attending: Internal Medicine | Admitting: Internal Medicine

## 2019-12-19 ENCOUNTER — Other Ambulatory Visit: Payer: Self-pay

## 2019-12-19 ENCOUNTER — Other Ambulatory Visit: Payer: Self-pay | Admitting: Internal Medicine

## 2019-12-19 DIAGNOSIS — Z90710 Acquired absence of both cervix and uterus: Secondary | ICD-10-CM | POA: Diagnosis not present

## 2019-12-19 DIAGNOSIS — R109 Unspecified abdominal pain: Secondary | ICD-10-CM

## 2019-12-19 DIAGNOSIS — Z9049 Acquired absence of other specified parts of digestive tract: Secondary | ICD-10-CM | POA: Diagnosis not present

## 2019-12-19 DIAGNOSIS — N200 Calculus of kidney: Secondary | ICD-10-CM | POA: Diagnosis not present

## 2019-12-19 DIAGNOSIS — Z6828 Body mass index (BMI) 28.0-28.9, adult: Secondary | ICD-10-CM | POA: Diagnosis not present

## 2019-12-19 DIAGNOSIS — E663 Overweight: Secondary | ICD-10-CM | POA: Diagnosis not present

## 2019-12-19 DIAGNOSIS — R1032 Left lower quadrant pain: Secondary | ICD-10-CM | POA: Diagnosis not present

## 2019-12-19 MED ORDER — IOHEXOL 9 MG/ML PO SOLN
ORAL | Status: AC
  Filled 2019-12-19: qty 1000

## 2019-12-20 DIAGNOSIS — Z20822 Contact with and (suspected) exposure to covid-19: Secondary | ICD-10-CM | POA: Diagnosis not present

## 2019-12-29 ENCOUNTER — Other Ambulatory Visit: Payer: Self-pay

## 2019-12-29 ENCOUNTER — Encounter (HOSPITAL_COMMUNITY): Payer: Self-pay | Admitting: *Deleted

## 2019-12-29 ENCOUNTER — Inpatient Hospital Stay (HOSPITAL_COMMUNITY)
Admission: EM | Admit: 2019-12-29 | Discharge: 2020-01-01 | DRG: 177 | Disposition: A | Discharge status: Home / Self Care | Payer: BC Managed Care – PPO | Attending: Family Medicine | Admitting: Family Medicine

## 2019-12-29 ENCOUNTER — Emergency Department (HOSPITAL_COMMUNITY): Payer: BC Managed Care – PPO

## 2019-12-29 DIAGNOSIS — U071 COVID-19: Secondary | ICD-10-CM | POA: Diagnosis not present

## 2019-12-29 DIAGNOSIS — Z87891 Personal history of nicotine dependence: Secondary | ICD-10-CM | POA: Diagnosis not present

## 2019-12-29 DIAGNOSIS — J1282 Pneumonia due to coronavirus disease 2019: Secondary | ICD-10-CM | POA: Diagnosis not present

## 2019-12-29 DIAGNOSIS — A0839 Other viral enteritis: Secondary | ICD-10-CM | POA: Diagnosis present

## 2019-12-29 DIAGNOSIS — R109 Unspecified abdominal pain: Secondary | ICD-10-CM | POA: Diagnosis present

## 2019-12-29 DIAGNOSIS — Z881 Allergy status to other antibiotic agents status: Secondary | ICD-10-CM | POA: Diagnosis not present

## 2019-12-29 DIAGNOSIS — Z9049 Acquired absence of other specified parts of digestive tract: Secondary | ICD-10-CM | POA: Diagnosis not present

## 2019-12-29 DIAGNOSIS — J189 Pneumonia, unspecified organism: Secondary | ICD-10-CM | POA: Diagnosis not present

## 2019-12-29 DIAGNOSIS — K219 Gastro-esophageal reflux disease without esophagitis: Secondary | ICD-10-CM | POA: Diagnosis not present

## 2019-12-29 DIAGNOSIS — Z91040 Latex allergy status: Secondary | ICD-10-CM

## 2019-12-29 DIAGNOSIS — M797 Fibromyalgia: Secondary | ICD-10-CM | POA: Diagnosis present

## 2019-12-29 DIAGNOSIS — Z8349 Family history of other endocrine, nutritional and metabolic diseases: Secondary | ICD-10-CM

## 2019-12-29 DIAGNOSIS — F419 Anxiety disorder, unspecified: Secondary | ICD-10-CM | POA: Diagnosis not present

## 2019-12-29 DIAGNOSIS — K589 Irritable bowel syndrome without diarrhea: Secondary | ICD-10-CM | POA: Diagnosis present

## 2019-12-29 DIAGNOSIS — K6389 Other specified diseases of intestine: Secondary | ICD-10-CM | POA: Diagnosis not present

## 2019-12-29 DIAGNOSIS — E860 Dehydration: Secondary | ICD-10-CM

## 2019-12-29 DIAGNOSIS — J9601 Acute respiratory failure with hypoxia: Secondary | ICD-10-CM | POA: Diagnosis present

## 2019-12-29 DIAGNOSIS — Z800 Family history of malignant neoplasm of digestive organs: Secondary | ICD-10-CM

## 2019-12-29 DIAGNOSIS — Z882 Allergy status to sulfonamides status: Secondary | ICD-10-CM

## 2019-12-29 DIAGNOSIS — R1110 Vomiting, unspecified: Secondary | ICD-10-CM | POA: Diagnosis not present

## 2019-12-29 DIAGNOSIS — K529 Noninfective gastroenteritis and colitis, unspecified: Secondary | ICD-10-CM

## 2019-12-29 DIAGNOSIS — Z888 Allergy status to other drugs, medicaments and biological substances status: Secondary | ICD-10-CM

## 2019-12-29 DIAGNOSIS — J4520 Mild intermittent asthma, uncomplicated: Secondary | ICD-10-CM | POA: Diagnosis present

## 2019-12-29 DIAGNOSIS — D72819 Decreased white blood cell count, unspecified: Secondary | ICD-10-CM | POA: Diagnosis present

## 2019-12-29 DIAGNOSIS — Z79890 Hormone replacement therapy (postmenopausal): Secondary | ICD-10-CM

## 2019-12-29 DIAGNOSIS — Z90710 Acquired absence of both cervix and uterus: Secondary | ICD-10-CM | POA: Diagnosis not present

## 2019-12-29 DIAGNOSIS — Z79899 Other long term (current) drug therapy: Secondary | ICD-10-CM

## 2019-12-29 DIAGNOSIS — I878 Other specified disorders of veins: Secondary | ICD-10-CM | POA: Diagnosis not present

## 2019-12-29 LAB — COMPREHENSIVE METABOLIC PANEL
ALT: 21 U/L (ref 0–44)
AST: 29 U/L (ref 15–41)
Albumin: 3.4 g/dL — ABNORMAL LOW (ref 3.5–5.0)
Alkaline Phosphatase: 53 U/L (ref 38–126)
Anion gap: 12 (ref 5–15)
BUN: 11 mg/dL (ref 6–20)
CO2: 24 mmol/L (ref 22–32)
Calcium: 8.6 mg/dL — ABNORMAL LOW (ref 8.9–10.3)
Chloride: 99 mmol/L (ref 98–111)
Creatinine, Ser: 0.95 mg/dL (ref 0.44–1.00)
GFR calc Af Amer: >60 mL/min (ref >60)
GFR calc non Af Amer: >60 mL/min (ref >60)
Glucose, Bld: 103 mg/dL — ABNORMAL HIGH (ref 70–99)
Potassium: 4.3 mmol/L (ref 3.5–5.1)
Sodium: 135 mmol/L (ref 135–145)
Total Bilirubin: 0.6 mg/dL (ref 0.3–1.2)
Total Protein: 7.3 g/dL (ref 6.5–8.1)

## 2019-12-29 LAB — CBC
HCT: 45.1 % (ref 36.0–46.0)
Hemoglobin: 15.4 g/dL — ABNORMAL HIGH (ref 12.0–15.0)
MCH: 32 pg (ref 26.0–34.0)
MCHC: 34.1 g/dL (ref 30.0–36.0)
MCV: 93.8 fL (ref 80.0–100.0)
Platelets: 197 10*3/uL (ref 150–400)
RBC: 4.81 MIL/uL (ref 3.87–5.11)
RDW: 12.1 % (ref 11.5–15.5)
WBC: 5 10*3/uL (ref 4.0–10.5)
nRBC: 0 % (ref 0.0–0.2)

## 2019-12-29 LAB — LIPASE, BLOOD: Lipase: 65 U/L — ABNORMAL HIGH (ref 11–51)

## 2019-12-29 MED ORDER — SODIUM CHLORIDE 0.9 % IV BOLUS
1000.0000 mL | Freq: Once | INTRAVENOUS | Status: AC
  Administered 2019-12-29: 1000 mL via INTRAVENOUS

## 2019-12-29 MED ORDER — ONDANSETRON HCL 4 MG/2ML IJ SOLN
4.0000 mg | Freq: Once | INTRAMUSCULAR | Status: AC
  Administered 2019-12-29: 4 mg via INTRAVENOUS
  Filled 2019-12-29: qty 2

## 2019-12-29 NOTE — ED Provider Notes (Signed)
[facility] EMERGENCY DEPARTMENT Provider Note   CSN: [id number] Arrival date & time: 12/29/19  1501     History Chief Complaint  Patient presents with  . Emesis    Mary Oconnell is a 54 y.o. female.  Patient with history of fibromyalgia, IBS, recurrent right lower quadrant pain, asthma presenting with 10-day history of nausea, vomiting, diarrhea.  States symptoms started several days before August 7.  She was diagnosed with Covid on August 7 at [facility].  Her daughter tested positive as well.  She is a multiple episodes of emesis for the past week.  She estimates 5-10 times daily.  Intermittent diarrhea as well.  Both emesis and diarrhea are nonbloody.  Intermittent fevers up to 102.  Diffuse abdominal cramping.  Some cough, runny nose and sore throat.  No significant chest pain or shortness of breath.  No pain with urination or blood in the urine.  Comes in today because she feels like she is unable to keep anything down.  Not able to eat or drink at all yesterday and today.  Sent in by her PCP which called him today. Previous cholecystectomy and hysterectomy.  Still has appendix.  Underwent CT scan August 6 by her PCP for abdominal pain and results were reassuring. Denies any significant abdominal pain today.  The history is provided by the patient.  Emesis Associated symptoms: abdominal pain, arthralgias, cough, diarrhea, fever and myalgias   Associated symptoms: no headaches        Past Medical History:  Diagnosis Date  . Allergy   . Anxiety   . Asthma   . Breast cyst    Right-cyst was drained in March 2014  . Burning with urination 01/11/2015  . BV (bacterial vaginosis) 09/30/2015  . Cold sore 06/19/2014  . Constipated 05/27/2013  . Current use of estrogen therapy 09/21/2015  . Fibroid   . Fibromyalgia   . Hematuria 01/11/2015  . History of cold sores   . Hypothyroid 06/26/2013   Pt states does not have hypothyroid  . IBS (irritable bowel syndrome)   . Pain, abdominal, RLQ  06/17/2013  . RLQ abdominal pain 06/01/2014  . Urinary frequency 06/17/2013  . Vaginal discharge 09/30/2015  . Vaginal pain 09/30/2015  . Vertigo   . Vitamin D deficiency   . Yeast infection 05/27/2013    Patient Active Problem List   Diagnosis Date Noted  . RLQ abdominal pain 04/09/2019  . Encounter for vitamin deficiency screening 04/09/2019  . Screening examination for STD (sexually transmitted disease) 04/09/2019  . Screening for colorectal cancer 12/05/2017  . Encounter for well woman exam with routine gynecological exam 12/05/2017  . Encounter for screening colonoscopy 02/28/2017  . BV (bacterial vaginosis) 09/30/2015  . Current use of estrogen therapy 09/21/2015  . Cold sore 06/19/2014  . Hypothyroid 06/26/2013  . OA (osteoarthritis) of finger 06/17/2013  . Constipation 05/27/2013  . Breast mass 08/26/2012  . Anxiety 08/26/2012  . H/O estrogen therapy 08/26/2012    Past Surgical History:  Procedure Laterality Date  . ABDOMINAL HYSTERECTOMY    . BIOPSY  04/11/2017   Procedure: BIOPSY;  Surgeon: Daneil Dolin, MD;  Location: [facility];  Service: Endoscopy;;  duodenal  . CARPAL TUNNEL RELEASE Left 12/31/2012   Procedure: LEFT CARPAL TUNNEL RELEASE;  Surgeon: Sanjuana Kava, MD;  Location: [facility] OR[facility];  Service: Orthopedics;  Laterality: Left;  . CHOLECYSTECTOMY N/A 04/18/2013   Procedure: LAPAROSCOPIC CHOLECYSTECTOMY;  Surgeon: Jamesetta So, MD;  Location: [facility];  Service: General;  Laterality: N/A;  . COLONOSCOPY WITH PROPOFOL N/A 04/11/2017   Procedure: COLONOSCOPY WITH PROPOFOL;  Surgeon: Daneil Dolin, MD;  Location: [facility];  Service: Endoscopy;  Laterality: N/A;  12:00pm  . ESOPHAGOGASTRODUODENOSCOPY (EGD) WITH PROPOFOL N/A 04/11/2017   Procedure: ESOPHAGOGASTRODUODENOSCOPY (EGD) WITH PROPOFOL;  Surgeon: Daneil Dolin, MD;  Location: [facility];  Service: Endoscopy;  Laterality: N/A;  . Venia Minks DILATION N/A 04/11/2017   Procedure: Venia Minks DILATION;  Surgeon:  Daneil Dolin, MD;  Location: [facility];  Service: Endoscopy;  Laterality: N/A;  . TONSILLECTOMY    . TUBAL LIGATION       OB History    Gravida  3   Para  3   Term  3   Preterm      AB      Living  3     SAB      TAB      Ectopic      Multiple      Live Births  3           Family History  Problem Relation Age of Onset  . Thyroid disease Mother   . Cancer Father        lung  . Cancer Maternal Grandmother 40       breast   . Colon cancer Maternal Grandfather   . Crohn's disease Sister     Social History   Tobacco Use  . Smoking status: Former Smoker    Packs/day: 0.50    Years: 5.00    Pack years: 2.50    Types: Cigarettes  . Smokeless tobacco: Never Used  . Tobacco comment: Quit in 1996  Vaping Use  . Vaping Use: Never used  Substance Use Topics  . Alcohol use: Not Currently    Comment: occ wine  . Drug use: No    Home Medications Prior to Admission medications   Medication Sig Start Date End Date Taking? Authorizing Provider  albuterol (PROVENTIL HFA;VENTOLIN HFA) 108 (90 Base) MCG/ACT inhaler Inhale 2 puffs into the lungs every 6 (six) hours as needed for wheezing or shortness of breath. Patient taking differently: Inhale 1 puff every 6 (six) hours as needed into the lungs for wheezing or shortness of breath.  09/21/15   Estill Dooms, NP  Ascorbic Acid (VITAMIN C GUMMIES PO) Take 2 each by mouth every morning.    [provider]  azithromycin (ZITHROMAX) 250 MG tablet Take 250-500 mg by mouth See admin instructions. Take two tablets on day 1, then take one tablet on days 2 through 5. Starting/prescribed on 06/03/2018 06/03/18   [provider]  Cholecalciferol (VITAMIN D3 PO) Take 0.5 mLs daily by mouth.    [provider]  Cyanocobalamin ([facility] B12 GUMMIES PO) Take 2 each by mouth every morning.    [provider]  estradiol (VIVELLE-DOT) 0.1 MG/24HR patch APPLY 1 PATCH EXTERNALLY TO THE SKIN 2  TIMES A WEEK 08/18/19   Derrek Monaco A, NP  fluticasone (FLONASE) 50 MCG/ACT nasal spray Place 1 spray into both nostrils every morning.     [provider]  ibuprofen (ADVIL,MOTRIN) 800 MG tablet Take 800 mg by mouth every 8 (eight) hours as needed for moderate pain.    [provider]  LORazepam (ATIVAN) 0.5 MG tablet Take 1 tablet (0.5 mg total) by mouth 3 (three) times daily. Patient taking differently: Take 0.5 mg daily as needed by mouth for anxiety.  09/18/13  Florian Buff, MD  pantoprazole (PROTONIX) 40 MG tablet Take 40 mg by mouth every morning. 04/15/18   [provider]  predniSONE (DELTASONE) 5 MG tablet Take 5 mg by mouth as directed. 42 tablets to take as directed prescribed on 06/03/2018 06/03/18   [provider]  valACYclovir (VALTREX) 1000 MG tablet TAKE 1 TABLET BY MOUTH DAILY 05/19/19   Estill Dooms, NP    Allergies    Iodine, Shellfish allergy, Almond (diagnostic), Ciprofloxacin, Other, Septra [sulfamethoxazole-trimethoprim], Latex, and Synthroid [levothyroxine sodium]  Review of Systems   Review of Systems  Constitutional: Positive for activity change, appetite change, fatigue and fever.  HENT: Negative for congestion and rhinorrhea.   Eyes: Negative for visual disturbance.  Respiratory: Positive for cough and shortness of breath. Negative for chest tightness.   Cardiovascular: Negative for chest pain.  Gastrointestinal: Positive for abdominal pain, diarrhea, nausea and vomiting.  Genitourinary: Negative for dysuria and hematuria.  Musculoskeletal: Positive for arthralgias and myalgias.  Neurological: Positive for weakness. Negative for dizziness and headaches.   all other systems are negative except as noted in the HPI and PMH.    Physical Exam Updated Vital Signs BP (!) 110/59   Pulse 88   Temp 99.7 F (37.6 C) (Oral)   Resp (!) 24   Ht 5\' 5"  (1.651 m)   Wt 77.1 kg   SpO2 95%   BMI 28.29 kg/m   Physical  Exam Vitals and nursing note reviewed.  Constitutional:      General: She is not in acute distress.    Appearance: She is well-developed.     Comments: Dry mucous membranes  HENT:     Head: Normocephalic and atraumatic.     Mouth/Throat:     Mouth: Mucous membranes are dry.     Pharynx: No oropharyngeal exudate.  Eyes:     Conjunctiva/sclera: Conjunctivae normal.     Pupils: Pupils are equal, round, and reactive to light.  Neck:     Comments: No meningismus. Cardiovascular:     Rate and Rhythm: Normal rate and regular rhythm.     Heart sounds: Normal heart sounds. No murmur heard.   Pulmonary:     Effort: Pulmonary effort is normal. No respiratory distress.     Breath sounds: Normal breath sounds.  Abdominal:     Palpations: Abdomen is soft.     Tenderness: There is abdominal tenderness. There is no guarding or rebound.     Comments: Mild diffuse tenderness, no guarding or rebound, no significant right lower quadrant pain.  Musculoskeletal:        General: No tenderness. Normal range of motion.     Cervical back: Normal range of motion and neck supple.     Comments: No CVAT  Skin:    General: Skin is warm.  Neurological:     Mental Status: She is alert and oriented to person, place, and time.     Cranial Nerves: No cranial nerve deficit.     Motor: No abnormal muscle tone.     Coordination: Coordination normal.     Comments:  5/5 strength throughout. CN 2-12 intact.Equal grip strength.   Psychiatric:        Behavior: Behavior normal.     ED Results / Procedures / Treatments   Labs (all labs ordered are listed, but only abnormal results are displayed) Labs Reviewed  SARS CORONAVIRUS 2 BY RT PCR ([facility]) - Abnormal; Notable for the following  components:      Result Value   SARS Coronavirus 2 POSITIVE (*)    All other components within normal limits  LIPASE, BLOOD - Abnormal; Notable for the following components:    Lipase 65 (*)    All other components within normal limits  COMPREHENSIVE METABOLIC PANEL - Abnormal; Notable for the following components:   Glucose, Bld 103 (*)    Calcium 8.6 (*)    Albumin 3.4 (*)    All other components within normal limits  CBC - Abnormal; Notable for the following components:   Hemoglobin 15.4 (*)    All other components within normal limits  URINALYSIS, ROUTINE W REFLEX MICROSCOPIC - Abnormal; Notable for the following components:   Color, Urine AMBER (*)    APPearance HAZY (*)    Ketones, ur 80 (*)    Protein, ur >=300 (*)    Bacteria, UA RARE (*)    All other components within normal limits  URINE CULTURE  POC URINE PREG, ED    EKG None  Radiology DG Abdomen Acute W/Chest  Result Date: 12/30/2019 CLINICAL DATA:  Vomiting.  COVID positive last week.  Cough. EXAM: DG ABDOMEN ACUTE W/ 1V CHEST COMPARISON:  Abdominopelvic CT 12/19/2019 FINDINGS: Patchy peripheral opacities within both lungs in a mid lower lung zone predominant distribution. The heart is normal in size. Normal mediastinal contours. No pneumothorax or large pleural effusion. No free intra-abdominal air. There is air throughout nondilated small and large bowel. Few air-fluid levels noted in the colon on upright view. No significant formed stool. No radiopaque calculi. Pelvic phleboliths. Cholecystectomy clips in the right upper quadrant. No acute osseous abnormalities are seen. IMPRESSION: 1. Patchy bilateral airspace disease consistent with COVID-19 pneumonia. 2. Air throughout nondilated small and large bowel with few air-fluid levels in the colon, suggesting gastroenteritis. No evidence of obstruction. Electronically Signed   By: Keith Rake M.D.   On: 12/30/2019 00:01    Procedures Procedures (including critical care time)  Medications Ordered in ED Medications  sodium chloride 0.9 % bolus 1,000 mL (has no administration in time range)  ondansetron (ZOFRAN) injection 4 mg (has no  administration in time range)    ED Course  I have reviewed the triage vital signs and the nursing notes.  Pertinent labs & imaging results that were available during my care of the patient were reviewed by me and considered in my medical decision making (see chart for details).    MDM Rules/Calculators/A&P                         Nausea, vomiting, diarrhea for the past 1 week with not being able to tolerate p.o. intake.  Covid positive on August 7. No significant respiratory distress or hypoxia.  Abdomen soft without peritoneal signs.  Patient will be hydrated gently.  Antiemetics.  Lipase slightly elevated at 65. LFTs normal.  UA with large ketones without evidence of infection.  Patient tachypneic but not hypoxic.  And soft without peritoneal signs. Labs are reassuring other than slight elevation of lipase.  Acute abdominal series is negative for bowel obstruction.  Patient continues to have nausea and dry heaving.  Desaturation to 87% with attempted ambulation. Chest x-ray does show bilateral infiltrates consistent with Covid pneumonia.  Patient mildly tachypneic and has developed new oxygen requirement.  We will initiate remdesivir, steroids and plan for admission. D/w Dr. Josephine Cables.  AVLEEN BORDWELL was evaluated in Emergency Department on 12/30/2019 for the  symptoms described in the history of present illness. She was evaluated in the context of the global COVID-19 pandemic, which necessitated consideration that the patient might be at risk for infection with the SARS-CoV-2 virus that causes COVID-19. Institutional protocols and algorithms that pertain to the evaluation of patients at risk for COVID-19 are in a state of rapid change based on information released by regulatory bodies including the CDC and federal and state organizations. These policies and algorithms were followed during the patient's care in the ED.  Final Clinical Impression(s) / ED Diagnoses Final diagnoses:   Pneumonia due to COVID-19 virus  Dehydration  Gastroenteritis    Rx / DC Orders ED Discharge Orders    None       Jeidi Gilles, Annie Main, MD 12/30/19 [phone]

## 2019-12-29 NOTE — ED Triage Notes (Signed)
Diagnosed with covid last week, states she is unable to keep any food or fluids down

## 2019-12-30 DIAGNOSIS — Z91040 Latex allergy status: Secondary | ICD-10-CM | POA: Diagnosis not present

## 2019-12-30 DIAGNOSIS — Z79890 Hormone replacement therapy (postmenopausal): Secondary | ICD-10-CM | POA: Diagnosis not present

## 2019-12-30 DIAGNOSIS — Z9049 Acquired absence of other specified parts of digestive tract: Secondary | ICD-10-CM | POA: Diagnosis not present

## 2019-12-30 DIAGNOSIS — F419 Anxiety disorder, unspecified: Secondary | ICD-10-CM | POA: Diagnosis present

## 2019-12-30 DIAGNOSIS — U071 COVID-19: Secondary | ICD-10-CM | POA: Diagnosis present

## 2019-12-30 DIAGNOSIS — A0839 Other viral enteritis: Secondary | ICD-10-CM | POA: Diagnosis present

## 2019-12-30 DIAGNOSIS — M797 Fibromyalgia: Secondary | ICD-10-CM | POA: Diagnosis present

## 2019-12-30 DIAGNOSIS — Z800 Family history of malignant neoplasm of digestive organs: Secondary | ICD-10-CM | POA: Diagnosis not present

## 2019-12-30 DIAGNOSIS — Z8349 Family history of other endocrine, nutritional and metabolic diseases: Secondary | ICD-10-CM | POA: Diagnosis not present

## 2019-12-30 DIAGNOSIS — J1282 Pneumonia due to coronavirus disease 2019: Secondary | ICD-10-CM | POA: Diagnosis present

## 2019-12-30 DIAGNOSIS — J9601 Acute respiratory failure with hypoxia: Secondary | ICD-10-CM | POA: Diagnosis present

## 2019-12-30 DIAGNOSIS — K589 Irritable bowel syndrome without diarrhea: Secondary | ICD-10-CM | POA: Diagnosis present

## 2019-12-30 DIAGNOSIS — E860 Dehydration: Secondary | ICD-10-CM | POA: Diagnosis present

## 2019-12-30 DIAGNOSIS — Z882 Allergy status to sulfonamides status: Secondary | ICD-10-CM | POA: Diagnosis not present

## 2019-12-30 DIAGNOSIS — R109 Unspecified abdominal pain: Secondary | ICD-10-CM | POA: Diagnosis not present

## 2019-12-30 DIAGNOSIS — J4520 Mild intermittent asthma, uncomplicated: Secondary | ICD-10-CM | POA: Diagnosis present

## 2019-12-30 DIAGNOSIS — Z888 Allergy status to other drugs, medicaments and biological substances status: Secondary | ICD-10-CM | POA: Diagnosis not present

## 2019-12-30 DIAGNOSIS — Z881 Allergy status to other antibiotic agents status: Secondary | ICD-10-CM | POA: Diagnosis not present

## 2019-12-30 DIAGNOSIS — Z79899 Other long term (current) drug therapy: Secondary | ICD-10-CM | POA: Diagnosis not present

## 2019-12-30 DIAGNOSIS — K219 Gastro-esophageal reflux disease without esophagitis: Secondary | ICD-10-CM | POA: Diagnosis present

## 2019-12-30 DIAGNOSIS — Z90710 Acquired absence of both cervix and uterus: Secondary | ICD-10-CM | POA: Diagnosis not present

## 2019-12-30 DIAGNOSIS — Z87891 Personal history of nicotine dependence: Secondary | ICD-10-CM | POA: Diagnosis not present

## 2019-12-30 DIAGNOSIS — D72819 Decreased white blood cell count, unspecified: Secondary | ICD-10-CM | POA: Diagnosis present

## 2019-12-30 LAB — URINALYSIS, ROUTINE W REFLEX MICROSCOPIC
Bilirubin Urine: NEGATIVE
Glucose, UA: NEGATIVE mg/dL
Hgb urine dipstick: NEGATIVE
Ketones, ur: 80 mg/dL — AB
Leukocytes,Ua: NEGATIVE
Nitrite: NEGATIVE
Protein, ur: >=300 mg/dL — AB
Specific Gravity, Urine: 1.02 (ref 1.005–1.030)
pH: 6 (ref 5.0–8.0)

## 2019-12-30 LAB — CBC
HCT: 44.4 % (ref 36.0–46.0)
Hemoglobin: 14.9 g/dL (ref 12.0–15.0)
MCH: 31.6 pg (ref 26.0–34.0)
MCHC: 33.6 g/dL (ref 30.0–36.0)
MCV: 94.3 fL (ref 80.0–100.0)
Platelets: 206 10*3/uL (ref 150–400)
RBC: 4.71 MIL/uL (ref 3.87–5.11)
RDW: 12 % (ref 11.5–15.5)
WBC: 2.9 10*3/uL — ABNORMAL LOW (ref 4.0–10.5)
nRBC: 0 % (ref 0.0–0.2)

## 2019-12-30 LAB — CREATININE, SERUM
Creatinine, Ser: 0.79 mg/dL (ref 0.44–1.00)
GFR calc Af Amer: >60 mL/min (ref >60)
GFR calc non Af Amer: >60 mL/min (ref >60)

## 2019-12-30 LAB — SARS CORONAVIRUS 2 BY RT PCR (HOSPITAL ORDER, PERFORMED IN ~~LOC~~ HOSPITAL LAB): SARS Coronavirus 2: POSITIVE — AB

## 2019-12-30 LAB — POC URINE PREG, ED: Preg Test, Ur: NEGATIVE

## 2019-12-30 LAB — ABO/RH: ABO/RH(D): B POS

## 2019-12-30 MED ORDER — PANTOPRAZOLE SODIUM 40 MG IV SOLR
40.0000 mg | INTRAVENOUS | Status: DC
  Administered 2019-12-30 – 2020-01-01 (×3): 40 mg via INTRAVENOUS
  Filled 2019-12-30 (×3): qty 40

## 2019-12-30 MED ORDER — VALACYCLOVIR HCL 500 MG PO TABS
1000.0000 mg | ORAL_TABLET | Freq: Every day | ORAL | Status: DC
  Administered 2019-12-30 – 2020-01-01 (×3): 1000 mg via ORAL
  Filled 2019-12-30 (×3): qty 2

## 2019-12-30 MED ORDER — ONDANSETRON HCL 4 MG PO TABS: 4.0000 mg | ORAL_TABLET | Freq: Four times a day (QID) | ORAL | Status: DC | PRN

## 2019-12-30 MED ORDER — ACETAMINOPHEN 325 MG PO TABS
650.0000 mg | ORAL_TABLET | Freq: Four times a day (QID) | ORAL | Status: DC | PRN
  Administered 2019-12-30 – 2020-01-01 (×4): 650 mg via ORAL
  Filled 2019-12-30 (×5): qty 2

## 2019-12-30 MED ORDER — SODIUM CHLORIDE 0.9 % IV SOLN
100.0000 mg | INTRAVENOUS | Status: AC
  Administered 2019-12-30 (×2): 100 mg via INTRAVENOUS
  Filled 2019-12-30 (×2): qty 20

## 2019-12-30 MED ORDER — ENOXAPARIN SODIUM 40 MG/0.4ML ~~LOC~~ SOLN
40.0000 mg | SUBCUTANEOUS | Status: DC
  Administered 2019-12-30 – 2020-01-01 (×3): 40 mg via SUBCUTANEOUS
  Filled 2019-12-30 (×3): qty 0.4

## 2019-12-30 MED ORDER — FLUTICASONE PROPIONATE 50 MCG/ACT NA SUSP
1.0000 | Freq: Every morning | NASAL | Status: DC
  Administered 2019-12-31 – 2020-01-01 (×2): 1 via NASAL
  Filled 2019-12-30 (×2): qty 16

## 2019-12-30 MED ORDER — ZINC SULFATE 220 (50 ZN) MG PO CAPS
220.0000 mg | ORAL_CAPSULE | Freq: Every day | ORAL | Status: DC
  Administered 2019-12-30 – 2020-01-01 (×3): 220 mg via ORAL
  Filled 2019-12-30 (×3): qty 1

## 2019-12-30 MED ORDER — GUAIFENESIN-DM 100-10 MG/5ML PO SYRP: 10.0000 mL | ORAL_SOLUTION | ORAL | Status: DC | PRN

## 2019-12-30 MED ORDER — SODIUM CHLORIDE 0.9 % IV BOLUS
1000.0000 mL | Freq: Once | INTRAVENOUS | Status: AC
  Administered 2019-12-30: 1000 mL via INTRAVENOUS

## 2019-12-30 MED ORDER — ONDANSETRON HCL 4 MG/2ML IJ SOLN
4.0000 mg | Freq: Four times a day (QID) | INTRAMUSCULAR | Status: DC | PRN
  Administered 2019-12-30 – 2019-12-31 (×2): 4 mg via INTRAVENOUS
  Filled 2019-12-30 (×2): qty 2

## 2019-12-30 MED ORDER — SCOPOLAMINE 1 MG/3DAYS TD PT72
1.0000 | MEDICATED_PATCH | TRANSDERMAL | Status: DC
  Administered 2019-12-30: 1.5 mg via TRANSDERMAL
  Filled 2019-12-30 (×2): qty 1

## 2019-12-30 MED ORDER — PROMETHAZINE HCL 25 MG/ML IJ SOLN
12.5000 mg | Freq: Once | INTRAMUSCULAR | Status: AC
  Administered 2019-12-30: 12.5 mg via INTRAVENOUS
  Filled 2019-12-30: qty 1

## 2019-12-30 MED ORDER — DEXAMETHASONE SODIUM PHOSPHATE 10 MG/ML IJ SOLN
6.0000 mg | INTRAMUSCULAR | Status: DC
  Administered 2019-12-30 – 2020-01-01 (×3): 6 mg via INTRAVENOUS
  Filled 2019-12-30 (×3): qty 1

## 2019-12-30 MED ORDER — DEXAMETHASONE SODIUM PHOSPHATE 10 MG/ML IJ SOLN
8.0000 mg | Freq: Once | INTRAMUSCULAR | Status: AC
  Administered 2019-12-30: 8 mg via INTRAVENOUS
  Filled 2019-12-30: qty 1

## 2019-12-30 MED ORDER — ASCORBIC ACID 500 MG PO TABS
500.0000 mg | ORAL_TABLET | Freq: Every day | ORAL | Status: DC
  Administered 2019-12-30 – 2020-01-01 (×3): 500 mg via ORAL
  Filled 2019-12-30 (×3): qty 1

## 2019-12-30 MED ORDER — LORAZEPAM 0.5 MG PO TABS
0.5000 mg | ORAL_TABLET | Freq: Once | ORAL | Status: AC | PRN
  Administered 2019-12-30: 0.5 mg via ORAL
  Filled 2019-12-30: qty 1

## 2019-12-30 MED ORDER — ALBUTEROL SULFATE HFA 108 (90 BASE) MCG/ACT IN AERS
2.0000 | INHALATION_SPRAY | Freq: Four times a day (QID) | RESPIRATORY_TRACT | Status: DC
  Administered 2019-12-30 – 2019-12-31 (×5): 2 via RESPIRATORY_TRACT
  Filled 2019-12-30: qty 6.7

## 2019-12-30 MED ORDER — HYDROCOD POLST-CPM POLST ER 10-8 MG/5ML PO SUER: 5.0000 mL | Freq: Two times a day (BID) | ORAL | Status: DC | PRN

## 2019-12-30 MED ORDER — SODIUM CHLORIDE 0.9 % IV SOLN
100.0000 mg | Freq: Every day | INTRAVENOUS | Status: DC
  Administered 2019-12-31 – 2020-01-01 (×2): 100 mg via INTRAVENOUS
  Filled 2019-12-30 (×3): qty 20

## 2019-12-30 NOTE — ED Notes (Signed)
Pt's O2 87% on RA while at rest. Placed pt on 2LPM via [place] O2 increased to 89%. Increased pt to 4LPM, O2 increased 94%.

## 2019-12-30 NOTE — ED Notes (Signed)
Ambulate pt in room. O2 dropped from 92% on RA to 87%. Pt denies SOB, just feels weak.

## 2019-12-30 NOTE — H&P (Signed)
History and Physical    Mary Oconnell MWU:[id number] DOB: 02/10/1966 DOA: 12/29/2019  PCP: Redmond School, MD   Patient coming from: Home  Chief Complaint: N/V/D with cough  HPI: Mary Oconnell is a 54 y.o. female with medical history significant for IBS, fibromyalgia, GERD, and asthma presented to the ED with worsening complaints of nausea, vomiting, diarrhea and mild cough that began approximately 10 days ago.  She [facility] with [facility]  She states that she got her illness from her daughter who was also positive for Covid.  Both she and her daughter are unvaccinated.  She has had poor oral intake with multiple episodes of emesis for the last week as well as intermittent diarrhea.  Her diarrhea is nonbloody and she does have some fevers up to 102 Fahrenheit.  She also has diffuse abdominal cramping.  She states she is also having a mild sore throat and runny nose.  She denies any chest pain or significant shortness of breath.  She denies any dysuria.  She had discussed her symptoms with her PCP who recommended come to the ED for further evaluation.  She did undergo a CT scan of her abdomen on 8/6 with PCP with no significant findings at that time.   ED Course: Vital signs are currently stable and patient is afebrile.  Covid testing is positive.  Her urine analysis is negative for any acute findings.  She has had x-ray of her chest as well as abdomen with findings consistent with some mild gastroenteritis as well as patchy pneumonia related to Covid.  She is currently on 2 L nasal cannula oxygen with no respiratory distress otherwise noted.  She is saturating in the mid 90th percentile.  She apparently dropped to the mid 80th percentile on room air prior to placement of nasal cannula.  She has been started on remdesivir and Decadron and has been given a 2 L fluid bolus.   Review of Systems: All others reviewed and otherwise negative except as noted above.  Past Medical  History:  Diagnosis Date  . Allergy   . Anxiety   . Asthma   . Breast cyst    Right-cyst was drained in March 2014  . Burning with urination 01/11/2015  . BV (bacterial vaginosis) 09/30/2015  . Cold sore 06/19/2014  . Constipated 05/27/2013  . Current use of estrogen therapy 09/21/2015  . Fibroid   . Fibromyalgia   . Hematuria 01/11/2015  . History of cold sores   . Hypothyroid 06/26/2013   Pt states does not have hypothyroid  . IBS (irritable bowel syndrome)   . Pain, abdominal, RLQ 06/17/2013  . RLQ abdominal pain 06/01/2014  . Urinary frequency 06/17/2013  . Vaginal discharge 09/30/2015  . Vaginal pain 09/30/2015  . Vertigo   . Vitamin D deficiency   . Yeast infection 05/27/2013    Past Surgical History:  Procedure Laterality Date  . ABDOMINAL HYSTERECTOMY    . BIOPSY  04/11/2017   Procedure: BIOPSY;  Surgeon: Daneil Dolin, MD;  [facility];  Service: Endoscopy;;  duodenal  . CARPAL TUNNEL RELEASE Left 12/31/2012   Procedure: LEFT CARPAL TUNNEL RELEASE;  Surgeon: Sanjuana Kava, MD;  Location: [facility];  Service: Orthopedics;  Laterality: Left;  . CHOLECYSTECTOMY N/A 04/18/2013   Procedure: LAPAROSCOPIC CHOLECYSTECTOMY;  Surgeon: Jamesetta So, MD;  Location: [facility];  Service: General;  Laterality: N/A;  . COLONOSCOPY WITH PROPOFOL N/A 04/11/2017   Procedure: COLONOSCOPY  WITH PROPOFOL;  Surgeon: Daneil Dolin, MD;  [facility];  Service: Endoscopy;  Laterality: N/A;  12:00pm  . ESOPHAGOGASTRODUODENOSCOPY (EGD) WITH PROPOFOL N/A 04/11/2017   Procedure: ESOPHAGOGASTRODUODENOSCOPY (EGD) WITH PROPOFOL;  Surgeon: Daneil Dolin, MD;  [facility];  Service: Endoscopy;  Laterality: N/A;  . Venia Minks DILATION N/A 04/11/2017   Procedure: Venia Minks DILATION;  Surgeon: Daneil Dolin, MD;  [facility];  Service: Endoscopy;  Laterality: N/A;  . TONSILLECTOMY    . TUBAL LIGATION       reports that she has quit smoking. Her smoking use included  cigarettes. She has a 2.50 pack-year smoking history. She has never used smokeless tobacco. She reports previous alcohol use. She reports that she does not use drugs.  Allergies  Allergen Reactions  . Iodine Shortness Of Breath and Rash    Pt claims she had allergic reaction with sob after receiving IV contrast in 2020.   Marland Kitchen Shellfish Allergy Anaphylaxis  . Almond (Diagnostic) Swelling    Oral swelling  . Ciprofloxacin Other (See Comments)    Unknown Reaction   . Other Other (See Comments)    Walnuts make patients lips turn blue.  . Septra [Sulfamethoxazole-Trimethoprim] Hives  . Latex Rash  . Synthroid [Levothyroxine Sodium] Palpitations    Family History  Problem Relation Age of Onset  . Thyroid disease Mother   . Cancer Father        lung  . Cancer Maternal Grandmother 40       breast   . Colon cancer Maternal Grandfather   . Crohn's disease Sister     Prior to Admission medications   Medication Sig Start Date End Date Taking? Authorizing Provider  albuterol (PROVENTIL HFA;VENTOLIN HFA) 108 (90 Base) MCG/ACT inhaler Inhale 2 puffs into the lungs every 6 (six) hours as needed for wheezing or shortness of breath. Patient taking differently: Inhale 1 puff every 6 (six) hours as needed into the lungs for wheezing or shortness of breath.  09/21/15   Estill Dooms, NP  Ascorbic Acid (VITAMIN C GUMMIES PO) Take 2 each by mouth every morning.    [provider]  azithromycin (ZITHROMAX) 250 MG tablet Take 250-500 mg by mouth See admin instructions. Take two tablets on day 1, then take one tablet on days 2 through 5. Starting/prescribed on 06/03/2018 06/03/18   [provider]  Cholecalciferol (VITAMIN D3 PO) Take 0.5 mLs daily by mouth.    [provider]  Cyanocobalamin ([facility] B12 GUMMIES PO) Take 2 each by mouth every morning.    [provider]  estradiol (VIVELLE-DOT) 0.1 MG/24HR patch APPLY 1 PATCH EXTERNALLY TO THE SKIN 2 TIMES A WEEK 08/18/19    Derrek Monaco A, NP  fluticasone (FLONASE) 50 MCG/ACT nasal spray Place 1 spray into both nostrils every morning.     [provider]  ibuprofen (ADVIL,MOTRIN) 800 MG tablet Take 800 mg by mouth every 8 (eight) hours as needed for moderate pain.    [provider]  LORazepam (ATIVAN) 0.5 MG tablet Take 1 tablet (0.5 mg total) by mouth 3 (three) times daily. Patient taking differently: Take 0.5 mg daily as needed by mouth for anxiety.  09/18/13   Florian Buff, MD  pantoprazole (PROTONIX) 40 MG tablet Take 40 mg by mouth every morning. 04/15/18   [provider]  predniSONE (DELTASONE) 5 MG tablet Take 5 mg by mouth as directed. 42 tablets to take as directed prescribed on 06/03/2018  06/03/18   [provider]  valACYclovir (VALTREX) 1000 MG tablet TAKE 1 TABLET BY MOUTH DAILY 05/19/19   Estill Dooms, NP    Physical Exam: Vitals:   12/30/19 0600 12/30/19 0630 12/30/19 0800 12/30/19 1100  BP: (!) 109/57 (!) 106/57 (!) 108/45 110/60  Pulse: 82 79 76 77  Resp: 18 17  (!) 22  Temp:      TempSrc:      SpO2: 93% 93% 93% 94%  Weight:      Height:        Constitutional: NAD, calm, comfortable Vitals:   12/30/19 0600 12/30/19 0630 12/30/19 0800 12/30/19 1100  BP: (!) 109/57 (!) 106/57 (!) 108/45 110/60  Pulse: 82 79 76 77  Resp: 18 17  (!) 22  Temp:      TempSrc:      SpO2: 93% 93% 93% 94%  Weight:      Height:       Eyes: lids and conjunctivae normal ENMT: Mucous membranes are moist.  Neck: normal, supple Respiratory: clear to auscultation bilaterally. Normal respiratory effort. No accessory muscle use.  Currently on 2 L nasal cannula oxygen. Cardiovascular: Regular rate and rhythm, no murmurs. No extremity edema. Abdomen: no tenderness, no distention. Bowel sounds positive.  Musculoskeletal:  No joint deformity upper and lower extremities.   Skin: no rashes, lesions, ulcers.  Psychiatric: Normal judgment and insight. Alert and oriented x 3.  Normal mood.   Labs on Admission: I have personally reviewed following labs and imaging studies  CBC: Recent Labs  Lab 12/29/19 1838  WBC 5.0  HGB 15.4*  HCT 45.1  MCV 93.8  PLT 967   Basic Metabolic Panel: Recent Labs  Lab 12/29/19 1838  NA 135  K 4.3  CL 99  CO2 24  GLUCOSE 103*  BUN 11  CREATININE 0.95  CALCIUM 8.6*   GFR: Estimated Creatinine Clearance: 70.3 mL/min (by C-G formula based on SCr of 0.95 mg/dL). Liver Function Tests: Recent Labs  Lab 12/29/19 1838  AST 29  ALT 21  ALKPHOS 53  BILITOT 0.6  PROT 7.3  ALBUMIN 3.4*   Recent Labs  Lab 12/29/19 1838  LIPASE 65*   No results for input(s): AMMONIA in the last 168 hours. Coagulation Profile: No results for input(s): INR, PROTIME in the last 168 hours. Cardiac Enzymes: No results for input(s): CKTOTAL, CKMB, CKMBINDEX, TROPONINI in the last 168 hours. BNP (last 3 results) No results for input(s): PROBNP in the last 8760 hours. HbA1C: No results for input(s): HGBA1C in the last 72 hours. CBG: No results for input(s): GLUCAP in the last 168 hours. Lipid Profile: No results for input(s): CHOL, HDL, LDLCALC, TRIG, CHOLHDL, LDLDIRECT in the last 72 hours. Thyroid Function Tests: No results for input(s): TSH, T4TOTAL, FREET4, T3FREE, THYROIDAB in the last 72 hours. Anemia Panel: No results for input(s): VITAMINB12, FOLATE, FERRITIN, TIBC, IRON, RETICCTPCT in the last 72 hours. Urine analysis:    Component Value Date/Time   COLORURINE AMBER (A) 12/30/2019 0120   APPEARANCEUR HAZY (A) 12/30/2019 0120   APPEARANCEUR Clear 01/11/2015 1113   LABSPEC 1.020 12/30/2019 0120   PHURINE 6.0 12/30/2019 0120   GLUCOSEU NEGATIVE 12/30/2019 0120   HGBUR NEGATIVE 12/30/2019 0120   BILIRUBINUR NEGATIVE 12/30/2019 0120   BILIRUBINUR Negative 01/11/2015 1113   KETONESUR 80 (A) 12/30/2019 0120   PROTEINUR >=300 (A) 12/30/2019 0120   UROBILINOGEN 0.2 06/17/2013 0915   NITRITE NEGATIVE 12/30/2019 0120    LEUKOCYTESUR NEGATIVE 12/30/2019 0120  Radiological Exams on Admission: DG Abdomen Acute W/Chest  Result Date: 12/30/2019 CLINICAL DATA:  Vomiting.  COVID positive last week.  Cough. EXAM: DG ABDOMEN ACUTE W/ 1V CHEST COMPARISON:  Abdominopelvic CT 12/19/2019 FINDINGS: Patchy peripheral opacities within both lungs in a mid lower lung zone predominant distribution. The heart is normal in size. Normal mediastinal contours. No pneumothorax or large pleural effusion. No free intra-abdominal air. There is air throughout nondilated small and large bowel. Few air-fluid levels noted in the colon on upright view. No significant formed stool. No radiopaque calculi. Pelvic phleboliths. Cholecystectomy clips in the right upper quadrant. No acute osseous abnormalities are seen. IMPRESSION: 1. Patchy bilateral airspace disease consistent with COVID-19 pneumonia. 2. Air throughout nondilated small and large bowel with few air-fluid levels in the colon, suggesting gastroenteritis. No evidence of obstruction. Electronically Signed   By: Keith Rake M.D.   On: 12/30/2019 00:01    Assessment/Plan Active Problems:   Pneumonia due to COVID-19 virus    Acute hypoxemic respiratory failure secondary to COVID-19 pneumonia with associated gastroenteritis -Plan to keep on remdesivir -Dexamethasone daily -Continue to follow inflammatory markers -Maintain on isolation -Antitussives and breathing treatments as needed -Vitamin C and zinc -Zofran as needed for nausea and vomiting -Imodium for diarrhea as needed -Clear liquid diet and advance as tolerated  History of mild asthma -Breathing treatments as needed as noted above  History of GERD -Maintain on PPI IV daily for now  History of IBS/fibromyalgia -Continue home medications as previous   DVT prophylaxis: Lovenox Code Status: Full Family Communication: None at bedside Disposition Plan:Admit for COVID treatment Consults called:None Admission  status:   Status is: Inpatient  Remains inpatient appropriate because:IV treatments appropriate due to intensity of illness or inability to take PO and Inpatient level of care appropriate due to severity of illness   Dispo: The patient is from: Home              Anticipated d/c is to: Home              Anticipated d/c date is: 2 days              Patient currently is not medically stable to d/c.  Press Casale D Manuella Ghazi DO [facility]  If 7PM-7AM, please contact night-coverage [email or web address]  12/30/2019, 11:18 AM

## 2019-12-30 NOTE — Plan of Care (Signed)

## 2019-12-30 NOTE — ED Notes (Signed)
Date and time results received: 12/30/19 0122 (use smartphrase ".now" to insert current time)  Test: covid Critical Value: positive  Name of Provider Notified: Dr Wyvonnia Dusky  Orders Received? Or Actions Taken?: Actions Taken: no orders received

## 2019-12-31 DIAGNOSIS — D72819 Decreased white blood cell count, unspecified: Secondary | ICD-10-CM | POA: Diagnosis present

## 2019-12-31 DIAGNOSIS — Z79899 Other long term (current) drug therapy: Secondary | ICD-10-CM

## 2019-12-31 DIAGNOSIS — F419 Anxiety disorder, unspecified: Secondary | ICD-10-CM

## 2019-12-31 DIAGNOSIS — K589 Irritable bowel syndrome without diarrhea: Secondary | ICD-10-CM | POA: Diagnosis present

## 2019-12-31 DIAGNOSIS — E860 Dehydration: Secondary | ICD-10-CM

## 2019-12-31 DIAGNOSIS — R109 Unspecified abdominal pain: Secondary | ICD-10-CM | POA: Diagnosis present

## 2019-12-31 LAB — COMPREHENSIVE METABOLIC PANEL
ALT: 21 U/L (ref 0–44)
AST: 27 U/L (ref 15–41)
Albumin: 2.9 g/dL — ABNORMAL LOW (ref 3.5–5.0)
Alkaline Phosphatase: 50 U/L (ref 38–126)
Anion gap: 10 (ref 5–15)
BUN: 14 mg/dL (ref 6–20)
CO2: 22 mmol/L (ref 22–32)
Calcium: 8.8 mg/dL — ABNORMAL LOW (ref 8.9–10.3)
Chloride: 106 mmol/L (ref 98–111)
Creatinine, Ser: 0.84 mg/dL (ref 0.44–1.00)
GFR calc Af Amer: >60 mL/min (ref >60)
GFR calc non Af Amer: >60 mL/min (ref >60)
Glucose, Bld: 149 mg/dL — ABNORMAL HIGH (ref 70–99)
Potassium: 3.7 mmol/L (ref 3.5–5.1)
Sodium: 138 mmol/L (ref 135–145)
Total Bilirubin: 0.6 mg/dL (ref 0.3–1.2)
Total Protein: 6.5 g/dL (ref 6.5–8.1)

## 2019-12-31 LAB — CBC WITH DIFFERENTIAL/PLATELET
Abs Immature Granulocytes: 0.02 10*3/uL (ref 0.00–0.07)
Basophils Absolute: 0 10*3/uL (ref 0.0–0.1)
Basophils Relative: 0 %
Eosinophils Absolute: 0 10*3/uL (ref 0.0–0.5)
Eosinophils Relative: 0 %
HCT: 42.5 % (ref 36.0–46.0)
Hemoglobin: 14.4 g/dL (ref 12.0–15.0)
Immature Granulocytes: 0 %
Lymphocytes Relative: 11 %
Lymphs Abs: 0.6 10*3/uL — ABNORMAL LOW (ref 0.7–4.0)
MCH: 31.9 pg (ref 26.0–34.0)
MCHC: 33.9 g/dL (ref 30.0–36.0)
MCV: 94 fL (ref 80.0–100.0)
Monocytes Absolute: 0.4 10*3/uL (ref 0.1–1.0)
Monocytes Relative: 9 %
Neutro Abs: 4.1 10*3/uL (ref 1.7–7.7)
Neutrophils Relative %: 80 %
Platelets: 255 10*3/uL (ref 150–400)
RBC: 4.52 MIL/uL (ref 3.87–5.11)
RDW: 12.2 % (ref 11.5–15.5)
WBC: 5.2 10*3/uL (ref 4.0–10.5)
nRBC: 0 % (ref 0.0–0.2)

## 2019-12-31 LAB — C-REACTIVE PROTEIN: CRP: 3.9 mg/dL — ABNORMAL HIGH (ref <1.0)

## 2019-12-31 LAB — D-DIMER, QUANTITATIVE: D-Dimer, Quant: 0.42 ug/mL-FEU (ref 0.00–0.50)

## 2019-12-31 LAB — FERRITIN: Ferritin: 283 ng/mL (ref 11–307)

## 2019-12-31 LAB — URINE CULTURE

## 2019-12-31 LAB — MAGNESIUM: Magnesium: 2 mg/dL (ref 1.7–2.4)

## 2019-12-31 MED ORDER — ALBUTEROL SULFATE HFA 108 (90 BASE) MCG/ACT IN AERS: 2.0000 | INHALATION_SPRAY | Freq: Three times a day (TID) | RESPIRATORY_TRACT | Status: DC

## 2019-12-31 MED ORDER — LORAZEPAM 1 MG PO TABS
1.0000 mg | ORAL_TABLET | Freq: Three times a day (TID) | ORAL | Status: DC | PRN
  Administered 2019-12-31: 1 mg via ORAL
  Filled 2019-12-31: qty 1

## 2019-12-31 MED ORDER — BOOST / RESOURCE BREEZE PO LIQD CUSTOM
1.0000 | Freq: Three times a day (TID) | ORAL | Status: DC
  Administered 2019-12-31: 1 via ORAL

## 2019-12-31 MED ORDER — PROSOURCE PLUS PO LIQD
30.0000 mL | Freq: Three times a day (TID) | ORAL | Status: DC
  Administered 2019-12-31: 30 mL via ORAL
  Filled 2019-12-31: qty 30

## 2019-12-31 MED ORDER — FLUTICASONE PROPIONATE 50 MCG/ACT NA SUSP: 1.0000 | Freq: Every day | NASAL | Status: DC

## 2019-12-31 NOTE — Progress Notes (Signed)
PROGRESS NOTE   Mary Oconnell  NAT:[id number] DOB: 04-13-1966 DOA: 12/29/2019 PCP: Redmond School, MD   Chief Complaint  Patient presents with  . Emesis    Brief Admission History:  54 y.o. female with medical history significant for IBS, fibromyalgia, GERD, and asthma presented to the ED with worsening complaints of nausea, vomiting, diarrhea and mild cough that began approximately 10 days ago.  She [facility]  She states that she got her illness from her daughter who was also positive for Covid.  Both she and her daughter are unvaccinated.  She has had poor oral intake with multiple episodes of emesis for the last week as well as intermittent diarrhea.  Her diarrhea is nonbloody and she does have some fevers up to 102 Fahrenheit.  She also has diffuse abdominal cramping.  She states she is also having a mild sore throat and runny nose.  She denies any chest pain or significant shortness of breath.  She denies any dysuria.  She had discussed her symptoms with her PCP who recommended come to the ED for further evaluation.    Assessment & Plan:   Principal Problem:   Pneumonia due to COVID-19 virus Active Problems:   Anxiety   Current use of estrogen therapy   Dehydration   IBS (irritable bowel syndrome)   Leukopenia   Abdominal cramping   1. Acute resp failure with hypoxia secondary to Covid pneumonia  - continue current supportive care, continue remdesivir, dexamethasone, follow inflammatory markers, continue bronchodilators and antitussives, zinc, vitamin C.  2. Covid associated gastroenteritis - continue IV fluids and supportive care.  Slowly advance diet further.  Soft diet ordered.  3. Mild intermittent asthma - stable.  Bronchodilators ordered.  4. GERD - continue PPI.  5. IBS/fibromyalgia - resumed home meds.   DVT prophylaxis:  Enoxaparin  Code Status:  Full  Family Communication: counseled patient at bedside  Disposition:   Status  is: Inpatient  Remains inpatient appropriate because:IV treatments appropriate due to intensity of illness or inability to take PO and Inpatient level of care appropriate due to severity of illness   Dispo: The patient is from: Home              Anticipated d/c is to: Home              Anticipated d/c date is: 1 day              Patient currently is not medically stable to d/c.  Consultants:     Procedures:     Antimicrobials:     Subjective: Still having stomach upset but slowly improving.  No emesis this morning.   Objective: Vitals:   12/31/19 0805 12/31/19 0819 12/31/19 1317 12/31/19 1349  BP:  (!) 110/49  96/82  Pulse:  80  87  Resp:  16  16  Temp:  97.8 F (36.6 C)  98 F (36.7 C)  TempSrc:  Oral  Oral  SpO2: 97% 96% 96% 95%  Weight:      Height:        Intake/Output Summary (Last 24 hours) at 12/31/2019 1431 Last data filed at 12/30/2019 2315 Gross per 24 hour  Intake 300 ml  Output --  Net 300 ml   Filed Weights   12/29/19 1806  Weight: 77.1 kg    Examination:  General exam: Appears calm and comfortable  Respiratory system: no increased WOB.  Cardiovascular system: S1 &  S2 heard, RRR. No JVD, murmurs, rubs, gallops or clicks. No pedal edema. Gastrointestinal system: Abdomen is nondistended, soft and nontender. No organomegaly or masses felt. Normal bowel sounds heard. Central nervous system: Alert and oriented. No focal neurological deficits. Extremities: Symmetric 5 x 5 power. Skin: No rashes, lesions or ulcers Psychiatry: Judgement and insight appear normal. Mood & affect appropriate.   Data Reviewed: I have personally reviewed following labs and imaging studies  CBC: Recent Labs  Lab 12/29/19 1838 12/30/19 1218 12/31/19 0642  WBC 5.0 2.9* 5.2  NEUTROABS  --   --  4.1  HGB 15.4* 14.9 14.4  HCT 45.1 44.4 42.5  MCV 93.8 94.3 94.0  PLT 197 206 413    Basic Metabolic Panel: Recent Labs  Lab 12/29/19 1838 12/30/19 1218  12/31/19 0642  NA 135  --  138  K 4.3  --  3.7  CL 99  --  106  CO2 24  --  22  GLUCOSE 103*  --  149*  BUN 11  --  14  CREATININE 0.95 0.79 0.84  CALCIUM 8.6*  --  8.8*  MG  --   --  2.0    GFR: Estimated Creatinine Clearance: 79.5 mL/min (by C-G formula based on SCr of 0.84 mg/dL).  Liver Function Tests: Recent Labs  Lab 12/29/19 1838 12/31/19 0642  AST 29 27  ALT 21 21  ALKPHOS 53 50  BILITOT 0.6 0.6  PROT 7.3 6.5  ALBUMIN 3.4* 2.9*    CBG: No results for input(s): GLUCAP in the last 168 hours.  Recent Results (from the past 240 hour(s))  SARS Coronavirus 2 by RT PCR (hospital order, performed in [facility] hospital lab) Nasopharyngeal Nasopharyngeal Swab     Status: Abnormal   Collection Time: 12/29/19 11:13 PM   Specimen: Nasopharyngeal Swab  Result Value Ref Range Status   SARS Coronavirus 2 POSITIVE (A) NEGATIVE Final    Comment: RESULT CALLED TO, READ BACK BY AND VERIFIED WITH: K BELTON,RN@0121  12/30/19 MKELLY (NOTE) SARS-CoV-2 target nucleic acids are DETECTED  SARS-CoV-2 RNA is generally detectable in upper respiratory specimens  during the acute phase of infection.  Positive results are indicative  of the presence of the identified virus, but do not rule out bacterial infection or co-infection with other pathogens not detected by the test.  Clinical correlation with patient history and  other diagnostic information is necessary to determine patient infection status.  The expected result is negative.  Fact Sheet for Patients:   StrictlyIdeas.no   Fact Sheet for Healthcare Providers:   BankingDealers.co.za    This test is not yet approved or cleared by the [place] FDA and  has been authorized for detection and/or diagnosis of SARS-CoV-2 by FDA under an Emergency Use Authorization (EUA).  This EUA will remain in effect (meaning this test  can be used) for the duration of  the COVID-19 declaration  under Section 564(b)(1) of the Act, 21 U.S.C. section 360-bbb-3(b)(1), unless the authorization is terminated or revoked sooner.  Performed at [facility], [address] [place], [place] [address]   Urine Culture     Status: Abnormal   Collection Time: 12/30/19  1:20 AM   Specimen: Urine, Clean Catch  Result Value Ref Range Status   Specimen Description   Final    URINE, CLEAN CATCH Performed at [facility], [address] [place], [place] [address]    Special Requests   Final    NONE Performed at [facility],  [address], [place],  [address]    Culture MULTIPLE SPECIES PRESENT, SUGGEST RECOLLECTION (A)  Final   Report Status 12/31/2019 FINAL  Final     Radiology Studies: DG Abdomen Acute W/Chest  Result Date: 12/30/2019 CLINICAL DATA:  Vomiting.  COVID positive last week.  Cough. EXAM: DG ABDOMEN ACUTE W/ 1V CHEST COMPARISON:  Abdominopelvic CT 12/19/2019 FINDINGS: Patchy peripheral opacities within both lungs in a mid lower lung zone predominant distribution. The heart is normal in size. Normal mediastinal contours. No pneumothorax or large pleural effusion. No free intra-abdominal air. There is air throughout nondilated small and large bowel. Few air-fluid levels noted in the colon on upright view. No significant formed stool. No radiopaque calculi. Pelvic phleboliths. Cholecystectomy clips in the right upper quadrant. No acute osseous abnormalities are seen. IMPRESSION: 1. Patchy bilateral airspace disease consistent with COVID-19 pneumonia. 2. Air throughout nondilated small and large bowel with few air-fluid levels in the colon, suggesting gastroenteritis. No evidence of obstruction. Electronically Signed   By: Keith Rake M.D.   On: 12/30/2019 00:01   Scheduled Meds: . albuterol  2 puff Inhalation Q6H  . vitamin C  500 mg Oral Daily  . dexamethasone (DECADRON) injection  6 mg Intravenous Q24H  . enoxaparin (LOVENOX) injection  40 mg Subcutaneous Q24H  .  fluticasone  1 spray Each Nare q morning - 10a  . pantoprazole (PROTONIX) IV  40 mg Intravenous Q24H  . scopolamine  1 patch Transdermal Q72H  . valACYclovir  1,000 mg Oral Daily  . zinc sulfate  220 mg Oral Daily   Continuous Infusions: . remdesivir 100 mg in NS 100 mL 100 mg (12/31/19 1023)     LOS: 1 day   Time spent: 2 mins   Dinesh Ulysse Wynetta Emery, MD How to contact the [facility] Attending or Consulting provider [address] or covering provider during after hours [place], for this patient?  1. Check the care team in [facility] and look for a) attending/consulting [facility] provider listed and b) the [facility] team listed 2. Log into [email or web address] and use [place]'s universal password to access. If you do not have the password, please contact the hospital operator. 3. Locate the [facility] provider you are looking for under [facility] and page to a number that you can be directly reached. 4. If you still have difficulty reaching the provider, please page the [facility] (Director on Call) for the Hospitalists listed on amion for assistance.  12/31/2019, 2:31 PM

## 2019-12-31 NOTE — Progress Notes (Signed)
Initial Nutrition Assessment  DOCUMENTATION CODES:   Not applicable  INTERVENTION:  ProSource Plus 30 ml po TID, each supplement provides 100 kcal and 15 grams of protein  Boost Breeze TID, each supplement provides 250 kcal and 9 grams of protein  Encouraged po intake of meals and supplements  Meal request provided to [employer] as medically feasible   NUTRITION DIAGNOSIS:   Inadequate oral intake related to nausea, vomiting, diarrhea as evidenced by per patient/family report.  GOAL:   Patient will meet greater than or equal to 90% of their needs    MONITOR:   PO intake, Supplement acceptance, Weight trends, Labs, I & O's, Diet advancement  REASON FOR ASSESSMENT:   Rounds    ASSESSMENT:  RD working remotely.  54 year old female with past medical history significant for IBS, fibromyalgia, GERD, asthma, and anxiety who [facility] with multiple episodes of daily emesis, intermittent diarrhea, abdominal pain, cough, and fevers admitted with pneumonia due to COVID-19 virus.  Diet advanced to full liquids this morning. Spoke with pt via phone this afternoon, reports feeling some better, had one episode of diarrhea this morning, no vomiting today, ongoing nausea. Patient reports tolerating full liquid lunch, requested mashed potatoes for dinner. RD called ambassador, plain mashed potatoes added to dinner order. Patient agreeable to drinking Ensure supplements, however severe iodine allergy reported, RD confirmed with patient. Will order Boost Breeze and ProSource TID.   Per chart weights have remained stable in the last 9 months.  Medications reviewed and include: Vit C, Protonix, Valtrex, Zinc sulfate Remdesivir Labs reviewed  NUTRITION - FOCUSED PHYSICAL EXAM: Unable to complete at this time, due to pt currenlty positive COVID-19     Diet Order:   Diet Order            Diet full liquid Room service appropriate?  Yes; Fluid consistency: Thin  Diet effective now                 EDUCATION NEEDS:   Education needs have been addressed  Skin:  Skin Assessment: Reviewed RN Assessment  Last BM:  8/18 -type 7 (per pt)  Height:   Ht Readings from Last 1 Encounters:  12/29/19 5\' 5"  (1.651 m)    Weight:   Wt Readings from Last 1 Encounters:  12/29/19 77.1 kg    Ideal Body Weight:  56.8 kg  BMI:  Body mass index is 28.29 kg/m.  Estimated Nutritional Needs:   Kcal:  2100-2300  Protein:  105-115  Fluid:  >/= 2.1 L/day   Lajuan Lines, RD, LDN Clinical Nutrition After Hours/Weekend Pager # in [place]

## 2019-12-31 NOTE — Plan of Care (Signed)

## 2020-01-01 ENCOUNTER — Ambulatory Visit (HOSPITAL_COMMUNITY): Payer: BC Managed Care – PPO

## 2020-01-01 LAB — CBC WITH DIFFERENTIAL/PLATELET
Abs Immature Granulocytes: 0.04 10*3/uL (ref 0.00–0.07)
Basophils Absolute: 0 10*3/uL (ref 0.0–0.1)
Basophils Relative: 0 %
Eosinophils Absolute: 0 10*3/uL (ref 0.0–0.5)
Eosinophils Relative: 0 %
HCT: 40 % (ref 36.0–46.0)
Hemoglobin: 13.7 g/dL (ref 12.0–15.0)
Immature Granulocytes: 1 %
Lymphocytes Relative: 8 %
Lymphs Abs: 0.6 10*3/uL — ABNORMAL LOW (ref 0.7–4.0)
MCH: 32.3 pg (ref 26.0–34.0)
MCHC: 34.3 g/dL (ref 30.0–36.0)
MCV: 94.3 fL (ref 80.0–100.0)
Monocytes Absolute: 0.6 10*3/uL (ref 0.1–1.0)
Monocytes Relative: 8 %
Neutro Abs: 6.3 10*3/uL (ref 1.7–7.7)
Neutrophils Relative %: 83 %
Platelets: 295 10*3/uL (ref 150–400)
RBC: 4.24 MIL/uL (ref 3.87–5.11)
RDW: 12.5 % (ref 11.5–15.5)
WBC: 7.5 10*3/uL (ref 4.0–10.5)
nRBC: 0 % (ref 0.0–0.2)

## 2020-01-01 LAB — COMPREHENSIVE METABOLIC PANEL
ALT: 17 U/L (ref 0–44)
AST: 18 U/L (ref 15–41)
Albumin: 2.7 g/dL — ABNORMAL LOW (ref 3.5–5.0)
Alkaline Phosphatase: 46 U/L (ref 38–126)
Anion gap: 12 (ref 5–15)
BUN: 12 mg/dL (ref 6–20)
CO2: 23 mmol/L (ref 22–32)
Calcium: 8.7 mg/dL — ABNORMAL LOW (ref 8.9–10.3)
Chloride: 106 mmol/L (ref 98–111)
Creatinine, Ser: 0.75 mg/dL (ref 0.44–1.00)
GFR calc Af Amer: >60 mL/min (ref >60)
GFR calc non Af Amer: >60 mL/min (ref >60)
Glucose, Bld: 127 mg/dL — ABNORMAL HIGH (ref 70–99)
Potassium: 3.8 mmol/L (ref 3.5–5.1)
Sodium: 141 mmol/L (ref 135–145)
Total Bilirubin: 0.4 mg/dL (ref 0.3–1.2)
Total Protein: 5.9 g/dL — ABNORMAL LOW (ref 6.5–8.1)

## 2020-01-01 LAB — MAGNESIUM: Magnesium: 2.1 mg/dL (ref 1.7–2.4)

## 2020-01-01 LAB — FERRITIN: Ferritin: 237 ng/mL (ref 11–307)

## 2020-01-01 LAB — D-DIMER, QUANTITATIVE: D-Dimer, Quant: <0.27 ug/mL-FEU (ref 0.00–0.50)

## 2020-01-01 LAB — C-REACTIVE PROTEIN: CRP: 0.9 mg/dL (ref <1.0)

## 2020-01-01 MED ORDER — HYDROCOD POLST-CPM POLST ER 10-8 MG/5ML PO SUER
5.0000 mL | Freq: Two times a day (BID) | ORAL | 0 refills | Status: DC | PRN
Start: 2020-01-01 — End: 2020-04-13

## 2020-01-01 MED ORDER — ZINC SULFATE 220 (50 ZN) MG PO CAPS: 220.0000 mg | ORAL_CAPSULE | Freq: Every day | ORAL | 0 refills | Status: DC

## 2020-01-01 MED ORDER — ACETAMINOPHEN 325 MG PO TABS: 650.0000 mg | ORAL_TABLET | Freq: Four times a day (QID) | ORAL | Status: DC | PRN

## 2020-01-01 MED ORDER — DEXAMETHASONE 6 MG PO TABS: 6.0000 mg | ORAL_TABLET | Freq: Every day | ORAL | 0 refills | Status: AC

## 2020-01-01 MED ORDER — ALBUTEROL SULFATE HFA 108 (90 BASE) MCG/ACT IN AERS: 2.0000 | INHALATION_SPRAY | Freq: Four times a day (QID) | RESPIRATORY_TRACT | Status: DC | PRN

## 2020-01-01 MED ORDER — SODIUM CHLORIDE 0.9 % IV SOLN: INTRAVENOUS | Status: DC

## 2020-01-01 NOTE — Plan of Care (Signed)
Problem: Education: Goal: Knowledge of General Education information will improve Description: Including pain rating scale, medication(s)/side effects and non-pharmacologic comfort measures 01/01/2020 1223 by Melony Overly, RN Outcome: Adequate for Discharge 01/01/2020 0911 by Melony Overly, RN Outcome: Progressing   Problem: Health Behavior/Discharge Planning: Goal: Ability to manage health-related needs will improve 01/01/2020 1223 by Melony Overly, RN Outcome: Adequate for Discharge 01/01/2020 0911 by Melony Overly, RN Outcome: Progressing   Problem: Clinical Measurements: Goal: Ability to maintain clinical measurements within normal limits will improve 01/01/2020 1223 by Melony Overly, RN Outcome: Adequate for Discharge 01/01/2020 0911 by Melony Overly, RN Outcome: Progressing Goal: Will remain free from infection 01/01/2020 1223 by Melony Overly, RN Outcome: Adequate for Discharge 01/01/2020 0911 by Melony Overly, RN Outcome: Progressing Goal: Diagnostic test results will improve 01/01/2020 1223 by Melony Overly, RN Outcome: Adequate for Discharge 01/01/2020 0911 by Melony Overly, RN Outcome: Progressing Goal: Respiratory complications will improve 01/01/2020 1223 by Melony Overly, RN Outcome: Adequate for Discharge 01/01/2020 0911 by Melony Overly, RN Outcome: Progressing Goal: Cardiovascular complication will be avoided 01/01/2020 1223 by Melony Overly, RN Outcome: Adequate for Discharge 01/01/2020 0911 by Melony Overly, RN Outcome: Progressing   Problem: Activity: Goal: Risk for activity intolerance will decrease 01/01/2020 1223 by Melony Overly, RN Outcome: Adequate for Discharge 01/01/2020 0911 by Melony Overly, RN Outcome: Progressing   Problem: Nutrition: Goal: Adequate nutrition will be maintained 01/01/2020 1223 by Melony Overly, RN Outcome: Adequate for Discharge 01/01/2020 0911 by Melony Overly, RN Outcome: Progressing    Problem: Coping: Goal: Level of anxiety will decrease 01/01/2020 1223 by Melony Overly, RN Outcome: Adequate for Discharge 01/01/2020 0911 by Melony Overly, RN Outcome: Progressing   Problem: Elimination: Goal: Will not experience complications related to bowel motility 01/01/2020 1223 by Melony Overly, RN Outcome: Adequate for Discharge 01/01/2020 0911 by Melony Overly, RN Outcome: Progressing Goal: Will not experience complications related to urinary retention 01/01/2020 1223 by Melony Overly, RN Outcome: Adequate for Discharge 01/01/2020 0911 by Melony Overly, RN Outcome: Progressing   Problem: Pain Managment: Goal: General experience of comfort will improve 01/01/2020 1223 by Melony Overly, RN Outcome: Adequate for Discharge 01/01/2020 0911 by Melony Overly, RN Outcome: Progressing   Problem: Safety: Goal: Ability to remain free from injury will improve 01/01/2020 1223 by Melony Overly, RN Outcome: Adequate for Discharge 01/01/2020 0911 by Melony Overly, RN Outcome: Progressing   Problem: Skin Integrity: Goal: Risk for impaired skin integrity will decrease 01/01/2020 1223 by Melony Overly, RN Outcome: Adequate for Discharge 01/01/2020 0911 by Melony Overly, RN Outcome: Progressing   Problem: Education: Goal: Knowledge of risk factors and measures for prevention of condition will improve 01/01/2020 1223 by Melony Overly, RN Outcome: Adequate for Discharge 01/01/2020 0911 by Melony Overly, RN Outcome: Progressing   Problem: Coping: Goal: Psychosocial and spiritual needs will be supported 01/01/2020 1223 by Melony Overly, RN Outcome: Adequate for Discharge 01/01/2020 0911 by Melony Overly, RN Outcome: Progressing   Problem: Respiratory: Goal: Will maintain a patent airway 01/01/2020 1223 by Melony Overly, RN Outcome: Adequate for Discharge 01/01/2020 0911 by Melony Overly, RN Outcome: Progressing Goal: Complications related to the  disease process, condition or treatment will be avoided or minimized 01/01/2020 1223 by Melony Overly, RN Outcome: Adequate for Discharge 01/01/2020 0911 by Melony Overly, RN Outcome:  Progressing

## 2020-01-01 NOTE — Plan of Care (Signed)

## 2020-01-01 NOTE — Progress Notes (Signed)
Patient scheduled for outpatient Remdesivir infusions tomorrow 8/20 and Saturday 8/21 at 2pm at [facility]. Please inform the patient to park at [place], as staff will be escorting the patient through the [place] entrance of the hospital.    There is a wave flag banner located near the entrance on [address]. Turn into this entrance and immediately turn left and park in 1 of the 5 designated Covid Infusion Parking spots. There is a phone number on the sign, please call and let the staff know what spot you are in and we will come out and get you. For questions call [phone].  Thanks.

## 2020-01-01 NOTE — Discharge Instructions (Signed)
Patient scheduled for outpatient Remdesivir infusions tomorrow 8/20 and Saturday 8/21 at 2pm at [facility]. Please inform the patient to park [place], [place], as staff will be escorting the patient through the [place] entrance of the hospital.  There is a wave flag banner located near the entrance on [address]. Turn into this entranceand immediatelyturn left and park in 1 of the 5 designated Covid Infusion Parking spots. There is a phone number on the sign, please call and let the staff know what spot you are in and we will come out and get you. For questions call [phone]. Thanks.    10 Things You Can Do to Manage Your COVID-19 Symptoms at Home If you have possible or confirmed COVID-19: 1. Stay home from work and school. And stay away from other public places. If you must go out, avoid using any kind of public transportation, ridesharing, or taxis. 2. Monitor your symptoms carefully. If your symptoms get worse, call your healthcare provider immediately. 3. Get rest and stay hydrated. 4. If you have a medical appointment, call the healthcare provider ahead of time and tell them that you have or may have COVID-19. 5. For medical emergencies, call 911 and notify the dispatch personnel that you have or may have COVID-19. 6. Cover your cough and sneezes with a tissue or use the inside of your elbow. 7. Wash your hands often with soap and water for at least 20 seconds or clean your hands with an alcohol-based hand sanitizer that contains at least 60% alcohol. 8. As much as possible, stay in a specific room and away from other people in your home. Also, you should use a separate bathroom, if available. If you need to be around other people in or outside of the home, wear a mask. 9. Avoid sharing personal items with other people in your household, like dishes, towels, and bedding. 10. Clean all surfaces that are touched often, like counters, tabletops, and doorknobs. Use  household cleaning sprays or wipes according to the label instructions. michellinders.com 11/13/2018 This information is not intended to replace advice given to you by your health care provider. Make sure you discuss any questions you have with your health care provider. Document Revised: 04/17/2019 Document Reviewed: 04/17/2019 Elsevier Patient Education  [place].   COVID-19 Frequently Asked Questions COVID-19 (coronavirus disease) is an infection that is caused by a large family of viruses. Some viruses cause illness in people and others cause illness in animals like camels, cats, and bats. In some cases, the viruses that cause illness in animals can spread to humans. Where did the coronavirus come from? In December 2019, [place] told the [facility]) of several cases of lung disease (human respiratory illness). These cases were linked to an open seafood and livestock market in the city of [place]. The link to the seafood and livestock market suggests that the virus may have spread from animals to humans. However, since that first outbreak in December, the virus has also been shown to spread from person to person. What is the name of the disease and the virus? Disease name Early on, this disease was called novel coronavirus. This is because scientists determined that the disease was caused by a new (novel) respiratory virus. The World Health Organization [facility]) has now named the disease COVID-19, or coronavirus disease. Virus name The virus that causes the disease is called severe acute respiratory syndrome coronavirus 2 (SARS-CoV-2). More information on disease and virus naming  World Health Organization [facility]): www.who.int/emergencies/diseases/novel-coronavirus-2019/technical-guidance/naming-the-coronavirus-disease-(covid-2019)-and-the-virus-that-causes-it Who is at risk for complications from coronavirus disease? Some people may be at higher risk for complications  from coronavirus disease. This includes older adults and people who have chronic diseases, such as heart disease, diabetes, and lung disease. If you are at higher risk for complications, take these extra precautions:  Stay home as much as possible.  Avoid social gatherings and travel.  Avoid close contact with others. Stay at least 6 ft (2 m) away from others, if possible.  Wash your hands often with soap and water for at least 20 seconds.  Avoid touching your face, mouth, nose, or eyes.  Keep supplies on hand at home, such as food, medicine, and cleaning supplies.  If you must go out in public, wear a cloth face covering or face mask. Make sure your mask covers your nose and mouth. How does coronavirus disease spread? The virus that causes coronavirus disease spreads easily from person to person (is contagious). You may catch the virus by:  Breathing in droplets from an infected person. Droplets can be spread by a person breathing, speaking, singing, coughing, or sneezing.  Touching something, like a table or a doorknob, that was exposed to the virus (contaminated) and then touching your mouth, nose, or eyes. Can I get the virus from touching surfaces or objects? There is still a lot that we do not know about the virus that causes coronavirus disease. Scientists are basing a lot of information on what they know about similar viruses, such as:  Viruses cannot generally survive on surfaces for long. They need a human body (host) to survive.  It is more likely that the virus is spread by close contact with people who are sick (direct contact), such as through: ? Shaking hands or hugging. ? Breathing in respiratory droplets that travel through the air. Droplets can be spread by a person breathing, speaking, singing, coughing, or sneezing.  It is less likely that the virus is spread when a person touches a surface or object that has the virus on it (indirect contact). The virus may be able  to enter the body if the person touches a surface or object and then touches his or her face, eyes, nose, or mouth. Can a person spread the virus without having symptoms of the disease? It may be possible for the virus to spread before a person has symptoms of the disease, but this is most likely not the main way the virus is spreading. It is more likely for the virus to spread by being in close contact with people who are sick and breathing in the respiratory droplets spread by a person breathing, speaking, singing, coughing, or sneezing. What are the symptoms of coronavirus disease? Symptoms vary from person to person and can range from mild to severe. Symptoms may include:  Fever or chills.  Cough.  Difficulty breathing or feeling short of breath.  Headaches, body aches, or muscle aches.  Runny or stuffy (congested) nose.  Sore throat.  New loss of taste or smell.  Nausea, vomiting, or diarrhea. These symptoms can appear anywhere from 2 to 14 days after you have been exposed to the virus. Some people may not have any symptoms. If you develop symptoms, call your health care provider. People with severe symptoms may need hospital care. Should I be tested for this virus? Your health care provider will decide whether to test you based on your symptoms, history of exposure, and your risk factors. How does  a health care provider test for this virus? Health care providers will collect samples to send for testing. Samples may include:  Taking a swab of fluid from the back of your nose and throat, your nose, or your throat.  Taking fluid from the lungs by having you cough up mucus (sputum) into a sterile cup.  Taking a blood sample. Is there a treatment or vaccine for this virus? Currently, there is no vaccine to prevent coronavirus disease. Also, there are no medicines like antibiotics or antivirals to treat the virus. A person who becomes sick is given supportive care, which means rest  and fluids. A person may also relieve his or her symptoms by using over-the-counter medicines that treat sneezing, coughing, and runny nose. These are the same medicines that a person takes for the common cold. If you develop symptoms, call your health care provider. People with severe symptoms may need hospital care. What can I do to protect myself and my family from this virus?     You can protect yourself and your family by taking the same actions that you would take to prevent the spread of other viruses. Take the following actions:  Wash your hands often with soap and water for at least 20 seconds. If soap and water are not available, use alcohol-based hand sanitizer.  Avoid touching your face, mouth, nose, or eyes.  Cough or sneeze into a tissue, sleeve, or elbow. Do not cough or sneeze into your hand or the air. ? If you cough or sneeze into a tissue, throw it away immediately and wash your hands.  Disinfect objects and surfaces that you frequently touch every day.  Stay away from people who are sick.  Avoid going out in public, follow guidance from your state and local health authorities.  Avoid crowded indoor spaces. Stay at least 6 ft (2 m) away from others.  If you must go out in public, wear a cloth face covering or face mask. Make sure your mask covers your nose and mouth.  Stay home if you are sick, except to get medical care. Call your health care provider before you get medical care. Your health care provider will tell you how long to stay home.  Make sure your vaccines are up to date. Ask your health care provider what vaccines you need. What should I do if I need to travel? Follow travel recommendations from your local health authority, the CDC, and WHO. Travel information and advice  Centers for Disease Control and Prevention (CDC): BodyEditor.hu  World Health Organization [facility]):  ThirdIncome.ca Know the risks and take action to protect your health  You are at higher risk of getting coronavirus disease if you are traveling to areas with an outbreak or if you are exposed to travelers from areas with an outbreak.  Wash your hands often and practice good hygiene to lower the risk of catching or spreading the virus. What should I do if I am sick? General instructions to stop the spread of infection  Wash your hands often with soap and water for at least 20 seconds. If soap and water are not available, use alcohol-based hand sanitizer.  Cough or sneeze into a tissue, sleeve, or elbow. Do not cough or sneeze into your hand or the air.  If you cough or sneeze into a tissue, throw it away immediately and wash your hands.  Stay home unless you must get medical care. Call your health care provider or local health authority before you  get medical care.  Avoid public areas. Do not take public transportation, if possible.  If you can, wear a mask if you must go out of the house or if you are in close contact with someone who is not sick. Make sure your mask covers your nose and mouth. Keep your home clean  Disinfect objects and surfaces that are frequently touched every day. This may include: ? Counters and tables. ? Doorknobs and light switches. ? Sinks and faucets. ? Electronics such as phones, remote controls, keyboards, computers, and tablets.  Wash dishes in hot, soapy water or use a dishwasher. Air-dry your dishes.  Wash laundry in hot water. Prevent infecting other household members  Let healthy household members care for children and pets, if possible. If you have to care for children or pets, wash your hands often and wear a mask.  Sleep in a different bedroom or bed, if possible.  Do not share personal items, such as razors, toothbrushes, deodorant, combs, brushes, towels, and washcloths. Where to find  more information Centers for Disease Control and Prevention (CDC)  Information and news updates: https://www.butler-gonzalez.com/ World Health Organization [facility])  Information and news updates: MissExecutive.com.ee  Coronavirus health topic: https://www.castaneda.info/  Questions and answers on COVID-19: OpportunityDebt.at  Global tracker: who.sprinklr.com American Academy of Pediatrics (AAP)  Information for families: www.healthychildren.org/English/health-issues/conditions/chest-lungs/Pages/2019-Novel-Coronavirus.aspx The coronavirus situation is changing rapidly. Check your local health authority website or the CDC and [facility] websites for updates and news. When should I contact a health care provider?  Contact your health care provider if you have symptoms of an infection, such as fever or cough, and you: ? Have been near anyone who is known to have coronavirus disease. ? Have come into contact with a person who is suspected to have coronavirus disease. ? Have traveled to an area where there is an outbreak of COVID-19. When should I get emergency medical care?  Get help right away by calling your local emergency services (911 in the [place] if you have: ? Trouble breathing. ? Pain or pressure in your chest. ? Confusion. ? Blue-tinged lips and fingernails. ? Difficulty waking from sleep. ? Symptoms that get worse. Let the emergency medical personnel know if you think you have coronavirus disease. Summary  A new respiratory virus is spreading from person to person and causing COVID-19 (coronavirus disease).  The virus that causes COVID-19 appears to spread easily. It spreads from one person to another through droplets from breathing, speaking, singing, coughing, or sneezing.  Older adults and those with chronic diseases are at higher risk of disease. If you are at higher risk for complications, take extra  precautions.  There is currently no vaccine to prevent coronavirus disease. There are no medicines, such as antibiotics or antivirals, to treat the virus.  You can protect yourself and your family by washing your hands often, avoiding touching your face, and covering your coughs and sneezes. This information is not intended to replace advice given to you by your health care provider. Make sure you discuss any questions you have with your health care provider. Document Revised: 02/28/2019 Document Reviewed: 08/27/2018 Elsevier Patient Education  [place] INFORMATION: PAY CLOSE ATTENTION   PHYSICIAN DISCHARGE INSTRUCTIONS  Follow with Primary care provider  Redmond School, MD  and other consultants as instructed by your Hospitalist Physician  [place] IF SYMPTOMS COME BACK, WORSEN OR NEW PROBLEM DEVELOPS   Please note: You were cared for by  a hospitalist during your hospital stay. Every effort will be made to forward records to your primary care provider.  You can request that your primary care provider send for your hospital records if they have not received them.  Once you are discharged, your primary care physician will handle any further medical issues. Please note that NO REFILLS for any discharge medications will be authorized once you are discharged, as it is imperative that you return to your primary care physician (or establish a relationship with a primary care physician if you do not have one) for your post hospital discharge needs so that they can reassess your need for medications and monitor your lab values.  Please get a complete blood count and chemistry panel checked by your Primary MD at your next visit, and again as instructed by your Primary MD.  Get Medicines reviewed and adjusted: Please take all your medications with you for your next visit with your Primary MD  Laboratory/radiological data: Please request  your Primary MD to go over all hospital tests and procedure/radiological results at the follow up, please ask your primary care provider to get all Hospital records sent to his/her office.  In some cases, they will be blood work, cultures and biopsy results pending at the time of your discharge. Please request that your primary care provider follow up on these results.  If you are diabetic, please bring your blood sugar readings with you to your follow up appointment with primary care.    Please call and make your follow up appointments as soon as possible.    Also Note the following: If you experience worsening of your admission symptoms, develop shortness of breath, life threatening emergency, suicidal or homicidal thoughts you must seek medical attention immediately by calling 911 or calling your MD immediately  if symptoms less severe.  You must read complete instructions/literature along with all the possible adverse reactions/side effects for all the Medicines you take and that have been prescribed to you. Take any new Medicines after you have completely understood and accpet all the possible adverse reactions/side effects.   Do not drive when taking Pain medications or sleeping medications (Benzodiazepines)  Do not take more than prescribed Pain, Sleep and Anxiety Medications. It is not advisable to combine anxiety,sleep and pain medications without talking with your primary care practitioner  Special Instructions: If you have smoked or chewed Tobacco  in the last 2 yrs please stop smoking, stop any regular Alcohol  and or any Recreational drug use.  Wear Seat belts while driving.  Do not drive if taking any narcotic, mind altering or controlled substances or recreational drugs or alcohol.

## 2020-01-01 NOTE — Discharge Summary (Signed)
Physician Discharge Summary  LAMOYNE PALENCIA HQP:[id number] DOB: 04/16/1966 DOA: 12/29/2019  PCP: Redmond School, MD  Admit date: 12/29/2019 Discharge date: 01/01/2020  Admitted From:  HOME  Disposition:  HOME   Recommendations for Outpatient Follow-up:  1. Arrangements made for outpatient remdesivir infusions for next 2 days to complete treatments  Discharge Condition: STABLE   CODE STATUS: FULL    Brief Hospitalization Summary: Please see all hospital notes, images, labs for full details of the hospitalization. ADMISSION HPI: EMALENE WELTE is a 54 y.o. female with medical history significant for IBS, fibromyalgia, GERD, and asthma presented to the ED with worsening complaints of nausea, vomiting, diarrhea and mild cough that began approximately 10 days ago.  She was diagnosed with Covid on 8/7 at a local [facility].  She states that she got her illness from her daughter who was also positive for Covid.  Both she and her daughter are unvaccinated.  She has had poor oral intake with multiple episodes of emesis for the last week as well as intermittent diarrhea.  Her diarrhea is nonbloody and she does have some fevers up to 102 Fahrenheit.  She also has diffuse abdominal cramping.  She states she is also having a mild sore throat and runny nose.  She denies any chest pain or significant shortness of breath.  She denies any dysuria.  She had discussed her symptoms with her PCP who recommended come to the ED for further evaluation.  She did undergo a CT scan of her abdomen on 8/6 with PCP with no significant findings at that time.   ED Course: Vital signs are currently stable and patient is afebrile.  Covid testing is positive.  Her urine analysis is negative for any acute findings.  She has had x-ray of her chest as well as abdomen with findings consistent with some mild gastroenteritis as well as patchy pneumonia related to Covid.  She is currently on 2 L nasal cannula oxygen with no respiratory distress  otherwise noted.  She is saturating in the mid 90th percentile.  She apparently dropped to the mid 80th percentile on room air prior to placement of nasal cannula.  She has been started on remdesivir and Decadron and has been given a 2 L fluid bolus.   Brief Admission History:  54 y.o.femalewith medical history significant forIBS, fibromyalgia, GERD, and asthma presented to the ED with worsening complaints of nausea, vomiting, diarrhea and mild cough that began approximately 10 days ago. She was [facility] with [facility]. She states that she got her illness from her daughter who was also positive for Covid. Both she and her daughter are unvaccinated. She has had poor oral intake with multiple episodes of emesis for the last week as well as intermittent diarrhea. Her diarrhea is nonbloody and she does have some fevers up to 102 Fahrenheit. She also has diffuse abdominal cramping. She states she is also having a mild sore throat and runny nose. She denies any chest pain or significant shortness of breath. She denies any dysuria. She had discussed her symptoms with her PCP who recommended come to the ED for further evaluation.   Assessment & Plan:   Principal Problem:   Pneumonia due to COVID-19 virus Active Problems:   Anxiety   Current use of estrogen therapy   Dehydration   IBS (irritable bowel syndrome)   Leukopenia   Abdominal cramping   1. Acute resp failure with hypoxia secondary to Covid pneumonia  -  treated and responded well to supportive care, continue remdesivir, dexamethasone, followed inflammatory markers, continue antitussives, zinc, vitamin C.  Pt remains off oxygen.  DC home.  Outpatient remdesivir clinic arrangements made for final 2 doses of treatment.   2. Covid associated gastroenteritis - Symptoms much better now, She was treated with IV fluids and supportive care.  Slowly advanced diet further.  Soft diet being tolerated.  3. Mild intermittent  asthma - stable.  Bronchodilators ordered as needed.  4. GERD - stable.  5. IBS/fibromyalgia - resumed home meds.   DVT prophylaxis:  Enoxaparin  Code Status:  Full  Family Communication: counseled patient at bedside  Disposition:  Home   Discharge Diagnoses:  Principal Problem:   Pneumonia due to COVID-19 virus Active Problems:   Anxiety   Current use of estrogen therapy   Dehydration   IBS (irritable bowel syndrome)   Leukopenia   Abdominal cramping   Discharge Instructions:  Allergies as of 01/01/2020      Reactions   Iodine Shortness Of Breath, Rash   Pt claims she had allergic reaction with sob after receiving IV contrast in 2020.    Shellfish Allergy Anaphylaxis   Almond (diagnostic) Swelling   Oral swelling   Ciprofloxacin Other (See Comments)   Unknown Reaction    Other Other (See Comments)   Walnuts make patients lips turn blue.   Septra [sulfamethoxazole-trimethoprim] Hives   Latex Rash   Synthroid [levothyroxine Sodium] Palpitations      Medication List    STOP taking these medications   ibuprofen 800 MG tablet Commonly known as: ADVIL     TAKE these medications   acetaminophen 325 MG tablet Commonly known as: TYLENOL Take 2 tablets (650 mg total) by mouth every 6 (six) hours as needed for mild pain or headache (fever >/= 101).   albuterol 108 (90 Base) MCG/ACT inhaler Commonly known as: VENTOLIN HFA Inhale 2 puffs into the lungs every 6 (six) hours as needed for wheezing or shortness of breath. What changed: how much to take   chlorpheniramine-HYDROcodone 10-8 MG/5ML Suer Commonly known as: TUSSIONEX Take 5 mLs by mouth every 12 (twelve) hours as needed for cough.   dexamethasone 6 MG tablet Commonly known as: DECADRON Take 1 tablet (6 mg total) by mouth daily for 7 days. Start taking on: January 02, 2020   estradiol 0.1 MG/24HR patch Commonly known as: VIVELLE-DOT APPLY 1 PATCH EXTERNALLY TO THE SKIN 2 TIMES A WEEK   LORazepam 1 MG  tablet Commonly known as: ATIVAN Take 1 mg by mouth 2 (two) times daily.   mometasone 50 MCG/ACT nasal spray Commonly known as: NASONEX Place 2 sprays into the nose daily.   pantoprazole 40 MG tablet Commonly known as: PROTONIX Take 40 mg by mouth every morning.   valACYclovir 1000 MG tablet Commonly known as: VALTREX TAKE 1 TABLET BY MOUTH DAILY   VITAMIN C GUMMIES PO Take 2 each by mouth every morning.   VITAMIN D3 PO Take 0.5 mLs daily by mouth.   zinc sulfate 220 (50 Zn) MG capsule Take 1 capsule (220 mg total) by mouth daily. Start taking on: January 02, 2020       Follow-up Information    Redmond School, MD. Schedule an appointment as soon as possible for a visit in 2 week(s).   Specialty: Internal Medicine Contact information: [address] [place] [address] [phone]              Allergies  Allergen Reactions  .  Iodine Shortness Of Breath and Rash    Pt claims she had allergic reaction with sob after receiving IV contrast in 2020.   Marland Kitchen Shellfish Allergy Anaphylaxis  . Almond (Diagnostic) Swelling    Oral swelling  . Ciprofloxacin Other (See Comments)    Unknown Reaction   . Other Other (See Comments)    Walnuts make patients lips turn blue.  . Septra [Sulfamethoxazole-Trimethoprim] Hives  . Latex Rash  . Synthroid [Levothyroxine Sodium] Palpitations   Allergies as of 01/01/2020      Reactions   Iodine Shortness Of Breath, Rash   Pt claims she had allergic reaction with sob after receiving IV contrast in 2020.    Shellfish Allergy Anaphylaxis   Almond (diagnostic) Swelling   Oral swelling   Ciprofloxacin Other (See Comments)   Unknown Reaction    Other Other (See Comments)   Walnuts make patients lips turn blue.   Septra [sulfamethoxazole-trimethoprim] Hives   Latex Rash   Synthroid [levothyroxine Sodium] Palpitations      Medication List    STOP taking these medications   ibuprofen 800 MG tablet Commonly known as:  ADVIL     TAKE these medications   acetaminophen 325 MG tablet Commonly known as: TYLENOL Take 2 tablets (650 mg total) by mouth every 6 (six) hours as needed for mild pain or headache (fever >/= 101).   albuterol 108 (90 Base) MCG/ACT inhaler Commonly known as: VENTOLIN HFA Inhale 2 puffs into the lungs every 6 (six) hours as needed for wheezing or shortness of breath. What changed: how much to take   chlorpheniramine-HYDROcodone 10-8 MG/5ML Suer Commonly known as: TUSSIONEX Take 5 mLs by mouth every 12 (twelve) hours as needed for cough.   dexamethasone 6 MG tablet Commonly known as: DECADRON Take 1 tablet (6 mg total) by mouth daily for 7 days. Start taking on: January 02, 2020   estradiol 0.1 MG/24HR patch Commonly known as: VIVELLE-DOT APPLY 1 PATCH EXTERNALLY TO THE SKIN 2 TIMES A WEEK   LORazepam 1 MG tablet Commonly known as: ATIVAN Take 1 mg by mouth 2 (two) times daily.   mometasone 50 MCG/ACT nasal spray Commonly known as: NASONEX Place 2 sprays into the nose daily.   pantoprazole 40 MG tablet Commonly known as: PROTONIX Take 40 mg by mouth every morning.   valACYclovir 1000 MG tablet Commonly known as: VALTREX TAKE 1 TABLET BY MOUTH DAILY   VITAMIN C GUMMIES PO Take 2 each by mouth every morning.   VITAMIN D3 PO Take 0.5 mLs daily by mouth.   zinc sulfate 220 (50 Zn) MG capsule Take 1 capsule (220 mg total) by mouth daily. Start taking on: January 02, 2020       Procedures/Studies: CT ABDOMEN PELVIS WO CONTRAST  Result Date: 12/19/2019 CLINICAL DATA:  Acute left lower quadrant abdominal pain. EXAM: CT ABDOMEN AND PELVIS WITHOUT CONTRAST TECHNIQUE: Multidetector CT imaging of the abdomen and pelvis was performed following the standard protocol without IV contrast. COMPARISON:  June 03, 2018. FINDINGS: Lower chest: No acute abnormality. Hepatobiliary: No focal liver abnormality is seen. Status post cholecystectomy. No biliary dilatation. Pancreas:  Unremarkable. No pancreatic ductal dilatation or surrounding inflammatory changes. Spleen: Normal in size without focal abnormality. Adrenals/Urinary Tract: Adrenal glands are unremarkable. Small nonobstructive right renal calculus is noted. No hydronephrosis or renal obstruction is noted. Bladder is unremarkable. Stomach/Bowel: Stomach is within normal limits. Appendix appears normal. No evidence of bowel wall thickening, distention, or inflammatory changes. Vascular/Lymphatic: No significant vascular  findings are present. No enlarged abdominal or pelvic lymph nodes. Reproductive: Status post hysterectomy. No adnexal masses. Other: No abdominal wall hernia or abnormality. No abdominopelvic ascites. Musculoskeletal: No acute or significant osseous findings. IMPRESSION: 1. Small nonobstructive right renal calculus. No hydronephrosis or renal obstruction is noted. 2. No other abnormality seen in the abdomen or pelvis. Electronically Signed   By: Marijo Conception M.D.   On: 12/19/2019 14:00   DG Abdomen Acute W/Chest  Result Date: 12/30/2019 CLINICAL DATA:  Vomiting.  COVID positive last week.  Cough. EXAM: DG ABDOMEN ACUTE W/ 1V CHEST COMPARISON:  Abdominopelvic CT 12/19/2019 FINDINGS: Patchy peripheral opacities within both lungs in a mid lower lung zone predominant distribution. The heart is normal in size. Normal mediastinal contours. No pneumothorax or large pleural effusion. No free intra-abdominal air. There is air throughout nondilated small and large bowel. Few air-fluid levels noted in the colon on upright view. No significant formed stool. No radiopaque calculi. Pelvic phleboliths. Cholecystectomy clips in the right upper quadrant. No acute osseous abnormalities are seen. IMPRESSION: 1. Patchy bilateral airspace disease consistent with COVID-19 pneumonia. 2. Air throughout nondilated small and large bowel with few air-fluid levels in the colon, suggesting gastroenteritis. No evidence of obstruction.  Electronically Signed   By: Keith Rake M.D.   On: 12/30/2019 00:01     Subjective: Pt says she is feeling much better today.  No specific complaints.  She remains off oxygen and she has been ambulating in room with no SOB or respiratory symptoms.    Discharge Exam: Vitals:   12/31/19 2121 01/01/20 0540  BP: (!) 89/63 (!) 98/51  Pulse: 94 62  Resp: 17 17  Temp: 98.2 F (36.8 C) 98 F (36.7 C)  SpO2: 95% 96%   Vitals:   12/31/19 1349 12/31/19 2034 12/31/19 2121 01/01/20 0540  BP: 96/82  (!) 89/63 (!) 98/51  Pulse: 87 74 94 62  Resp: 16 18 17 17   Temp: 98 F (36.7 C)  98.2 F (36.8 C) 98 F (36.7 C)  TempSrc: Oral  Oral   SpO2: 95% 96% 95% 96%  Weight:      Height:       General: Pt is alert, awake, not in acute distress Cardiovascular: normal S1/S2 +, no rubs, no gallops Respiratory: CTA bilaterally, no wheezing, no rhonchi Abdominal: Soft, NT, ND, bowel sounds + Extremities: no edema, no cyanosis   The results of significant diagnostics from this hospitalization (including imaging, microbiology, ancillary and laboratory) are listed below for reference.     Microbiology: Recent Results (from the past 240 hour(s))  SARS Coronavirus 2 by RT PCR (hospital order, performed in [facility] hospital lab) Nasopharyngeal Nasopharyngeal Swab     Status: Abnormal   Collection Time: 12/29/19 11:13 PM   Specimen: Nasopharyngeal Swab  Result Value Ref Range Status   SARS Coronavirus 2 POSITIVE (A) NEGATIVE Final    Comment: RESULT CALLED TO, READ BACK BY AND VERIFIED WITH: K BELTON,RN@0121  12/30/19 MKELLY (NOTE) SARS-CoV-2 target nucleic acids are DETECTED  SARS-CoV-2 RNA is generally detectable in upper respiratory specimens  during the acute phase of infection.  Positive results are indicative  of the presence of the identified virus, but do not rule out bacterial infection or co-infection with other pathogens not detected by the test.  Clinical correlation with  patient history and  other diagnostic information is necessary to determine patient infection status.  The expected result is negative.  Fact Sheet for Patients:   StrictlyIdeas.no  Fact Sheet for Healthcare Providers:   BankingDealers.co.za    This test is not yet approved or cleared by the [place] FDA and  has been authorized for detection and/or diagnosis of SARS-CoV-2 by FDA under an Emergency Use Authorization (EUA).  This EUA will remain in effect (meaning this test  can be used) for the duration of  the COVID-19 declaration under Section 564(b)(1) of the Act, 21 U.S.C. section 360-bbb-3(b)(1), unless the authorization is terminated or revoked sooner.  Performed at [facility], [address] [place], [place] [address]   Urine Culture     Status: Abnormal   Collection Time: 12/30/19  1:20 AM   Specimen: Urine, Clean Catch  Result Value Ref Range Status   Specimen Description   Final    URINE, CLEAN CATCH Performed at [facility], [address] [place], [place] [address]    Special Requests   Final    NONE Performed at [facility], [address] [place],  [address]    Culture MULTIPLE SPECIES PRESENT, SUGGEST RECOLLECTION (A)  Final   Report Status 12/31/2019 FINAL  Final     Labs: BNP (last 3 results) No results for input(s): BNP in the last 8760 hours. Basic Metabolic Panel: Recent Labs  Lab 12/29/19 1838 12/30/19 1218 12/31/19 0642 01/01/20 0549  NA 135  --  138 141  K 4.3  --  3.7 3.8  CL 99  --  106 106  CO2 24  --  22 23  GLUCOSE 103*  --  149* 127*  BUN 11  --  14 12  CREATININE 0.95 0.79 0.84 0.75  CALCIUM 8.6*  --  8.8* 8.7*  MG  --   --  2.0 2.1   Liver Function Tests: Recent Labs  Lab 12/29/19 1838 12/31/19 0642 01/01/20 0549  AST 29 27 18   ALT 21 21 17   ALKPHOS 53 50 46  BILITOT 0.6 0.6 0.4  PROT 7.3 6.5 5.9*  ALBUMIN 3.4* 2.9* 2.7*   Recent Labs  Lab 12/29/19 1838   LIPASE 65*   No results for input(s): AMMONIA in the last 168 hours. CBC: Recent Labs  Lab 12/29/19 1838 12/30/19 1218 12/31/19 0642 01/01/20 0549  WBC 5.0 2.9* 5.2 7.5  NEUTROABS  --   --  4.1 6.3  HGB 15.4* 14.9 14.4 13.7  HCT 45.1 44.4 42.5 40.0  MCV 93.8 94.3 94.0 94.3  PLT 197 206 255 295   Cardiac Enzymes: No results for input(s): CKTOTAL, CKMB, CKMBINDEX, TROPONINI in the last 168 hours. BNP: Invalid input(s): POCBNP CBG: No results for input(s): GLUCAP in the last 168 hours. D-Dimer Recent Labs    12/31/19 0642 01/01/20 0549  DDIMER 0.42 <0.27   Hgb A1c No results for input(s): HGBA1C in the last 72 hours. Lipid Profile No results for input(s): CHOL, HDL, LDLCALC, TRIG, CHOLHDL, LDLDIRECT in the last 72 hours. Thyroid function studies No results for input(s): TSH, T4TOTAL, T3FREE, THYROIDAB in the last 72 hours.  Invalid input(s): FREET3 Anemia work up Recent Labs    12/31/19 0642 01/01/20 0549  FERRITIN 283 237   Urinalysis    Component Value Date/Time   COLORURINE AMBER (A) 12/30/2019 0120   APPEARANCEUR HAZY (A) 12/30/2019 0120   APPEARANCEUR Clear 01/11/2015 1113   LABSPEC 1.020 12/30/2019 0120   PHURINE 6.0 12/30/2019 0120   GLUCOSEU NEGATIVE 12/30/2019 0120   HGBUR NEGATIVE 12/30/2019 0120   BILIRUBINUR NEGATIVE 12/30/2019 0120   BILIRUBINUR Negative 01/11/2015 1113  KETONESUR 80 (A) 12/30/2019 0120   PROTEINUR >=300 (A) 12/30/2019 0120   UROBILINOGEN 0.2 06/17/2013 0915   NITRITE NEGATIVE 12/30/2019 0120   LEUKOCYTESUR NEGATIVE 12/30/2019 0120   Sepsis Labs Invalid input(s): PROCALCITONIN,  WBC,  LACTICIDVEN Microbiology Recent Results (from the past 240 hour(s))  SARS Coronavirus 2 by RT PCR (hospital order, performed in [place] hospital lab) Nasopharyngeal Nasopharyngeal Swab     Status: Abnormal   Collection Time: 12/29/19 11:13 PM   Specimen: Nasopharyngeal Swab  Result Value Ref Range Status   SARS Coronavirus 2  POSITIVE (A) NEGATIVE Final    Comment: RESULT CALLED TO, READ BACK BY AND VERIFIED WITH: K BELTON,RN@0121  12/30/19 MKELLY (NOTE) SARS-CoV-2 target nucleic acids are DETECTED  SARS-CoV-2 RNA is generally detectable in upper respiratory specimens  during the acute phase of infection.  Positive results are indicative  of the presence of the identified virus, but do not rule out bacterial infection or co-infection with other pathogens not detected by the test.  Clinical correlation with patient history and  other diagnostic information is necessary to determine patient infection status.  The expected result is negative.  Fact Sheet for Patients:   StrictlyIdeas.no   Fact Sheet for Healthcare Providers:   BankingDealers.co.za    This test is not yet approved or cleared by the [place] FDA and  has been authorized for detection and/or diagnosis of SARS-CoV-2 by FDA under an Emergency Use Authorization (EUA).  This EUA will remain in effect (meaning this test  can be used) for the duration of  the COVID-19 declaration under Section 564(b)(1) of the Act, 21 U.S.C. section 360-bbb-3(b)(1), unless the authorization is terminated or revoked sooner.  Performed at [facility], [address] [place], [place] [address]   Urine Culture     Status: Abnormal   Collection Time: 12/30/19  1:20 AM   Specimen: Urine, Clean Catch  Result Value Ref Range Status   Specimen Description   Final    URINE, CLEAN CATCH Performed at [facility], [address], [place], [place] [address]    Special Requests   Final    NONE Performed at [facility], [address], [place], [place] [address]    Culture MULTIPLE SPECIES PRESENT, SUGGEST RECOLLECTION (A)  Final   Report Status 12/31/2019 FINAL  Final    Time coordinating discharge: 33 minutes   SIGNED:  Irwin Brakeman, MD  [facility] 01/01/2020, 12:33 PM How to contact the [facility]  Attending or Consulting provider [place] or covering provider during after hours [place], for this patient?  1. Check the care team in [facility] and look for a) attending/consulting [facility] provider listed and b) the [facility] team listed 2. Log into [email or web address] and use [place]'s universal password to access. If you do not have the password, please contact the hospital operator. 3. Locate the [facility] provider you are looking for under [facility] and page to a number that you can be directly reached. 4. If you still have difficulty reaching the provider, please page the [facility] (Director on Call) for the Hospitalists listed on amion for assistance.

## 2020-01-02 ENCOUNTER — Ambulatory Visit (HOSPITAL_COMMUNITY)
Admit: 2020-01-02 | Discharge: 2020-01-02 | Disposition: A | Discharge status: Home / Self Care | Payer: BC Managed Care – PPO | Attending: Pulmonary Disease | Admitting: Pulmonary Disease

## 2020-01-02 DIAGNOSIS — J1282 Pneumonia due to coronavirus disease 2019: Secondary | ICD-10-CM | POA: Diagnosis not present

## 2020-01-02 DIAGNOSIS — U071 COVID-19: Secondary | ICD-10-CM | POA: Diagnosis not present

## 2020-01-02 MED ORDER — FAMOTIDINE IN NACL 20-0.9 MG/50ML-% IV SOLN: 20.0000 mg | Freq: Once | INTRAVENOUS | Status: DC | PRN

## 2020-01-02 MED ORDER — METHYLPREDNISOLONE SODIUM SUCC 125 MG IJ SOLR: 125.0000 mg | Freq: Once | INTRAMUSCULAR | Status: DC | PRN

## 2020-01-02 MED ORDER — ALBUTEROL SULFATE HFA 108 (90 BASE) MCG/ACT IN AERS: 2.0000 | INHALATION_SPRAY | Freq: Once | RESPIRATORY_TRACT | Status: DC | PRN

## 2020-01-02 MED ORDER — SODIUM CHLORIDE 0.9 % IV SOLN: INTRAVENOUS | Status: DC | PRN

## 2020-01-02 MED ORDER — SODIUM CHLORIDE 0.9 % IV SOLN
100.0000 mg | Freq: Once | INTRAVENOUS | Status: AC
  Administered 2020-01-02: 100 mg via INTRAVENOUS
  Filled 2020-01-02: qty 20

## 2020-01-02 MED ORDER — EPINEPHRINE 0.3 MG/0.3ML IJ SOAJ: 0.3000 mg | Freq: Once | INTRAMUSCULAR | Status: DC | PRN

## 2020-01-02 MED ORDER — DIPHENHYDRAMINE HCL 50 MG/ML IJ SOLN: 50.0000 mg | Freq: Once | INTRAMUSCULAR | Status: DC | PRN

## 2020-01-02 NOTE — Discharge Instructions (Signed)
10 Things You Can Do to Manage Your COVID-19 Symptoms at Home If you have possible or confirmed COVID-19: 1. Stay home from work and school. And stay away from other public places. If you must go out, avoid using any kind of public transportation, ridesharing, or taxis. 2. Monitor your symptoms carefully. If your symptoms get worse, call your healthcare provider immediately. 3. Get rest and stay hydrated. 4. If you have a medical appointment, call the healthcare provider ahead of time and tell them that you have or may have COVID-19. 5. For medical emergencies, call 911 and notify the dispatch personnel that you have or may have COVID-19. 6. Cover your cough and sneezes with a tissue or use the inside of your elbow. 7. Wash your hands often with soap and water for at least 20 seconds or clean your hands with an alcohol-based hand sanitizer that contains at least 60% alcohol. 8. As much as possible, stay in a specific room and away from other people in your home. Also, you should use a separate bathroom, if available. If you need to be around other people in or outside of the home, wear a mask. 9. Avoid sharing personal items with other people in your household, like dishes, towels, and bedding. 10. Clean all surfaces that are touched often, like counters, tabletops, and doorknobs. Use household cleaning sprays or wipes according to the label instructions. cdc.gov/coronavirus 11/13/2018 This information is not intended to replace advice given to you by your health care provider. Make sure you discuss any questions you have with your health care provider. Document Revised: 04/17/2019 Document Reviewed: 04/17/2019 Elsevier Patient Education  2020 Elsevier Inc.  

## 2020-01-03 ENCOUNTER — Encounter (HOSPITAL_COMMUNITY): Payer: Self-pay

## 2020-01-03 ENCOUNTER — Ambulatory Visit (HOSPITAL_COMMUNITY)
Admit: 2020-01-03 | Discharge: 2020-01-03 | Disposition: A | Discharge status: Home / Self Care | Payer: BC Managed Care – PPO | Attending: Pulmonary Disease | Admitting: Pulmonary Disease

## 2020-01-03 DIAGNOSIS — U071 COVID-19: Secondary | ICD-10-CM | POA: Diagnosis not present

## 2020-01-03 DIAGNOSIS — J1282 Pneumonia due to coronavirus disease 2019: Secondary | ICD-10-CM | POA: Insufficient documentation

## 2020-01-03 MED ORDER — METHYLPREDNISOLONE SODIUM SUCC 125 MG IJ SOLR: 125.0000 mg | Freq: Once | INTRAMUSCULAR | Status: DC | PRN

## 2020-01-03 MED ORDER — EPINEPHRINE 0.3 MG/0.3ML IJ SOAJ: 0.3000 mg | Freq: Once | INTRAMUSCULAR | Status: DC | PRN

## 2020-01-03 MED ORDER — FAMOTIDINE IN NACL 20-0.9 MG/50ML-% IV SOLN: 20.0000 mg | Freq: Once | INTRAVENOUS | Status: DC | PRN

## 2020-01-03 MED ORDER — ALBUTEROL SULFATE HFA 108 (90 BASE) MCG/ACT IN AERS: 2.0000 | INHALATION_SPRAY | Freq: Once | RESPIRATORY_TRACT | Status: DC | PRN

## 2020-01-03 MED ORDER — ONDANSETRON HCL 4 MG/2ML IJ SOLN
4.0000 mg | Freq: Once | INTRAMUSCULAR | Status: AC
  Administered 2020-01-03: 4 mg via INTRAVENOUS
  Filled 2020-01-03: qty 2

## 2020-01-03 MED ORDER — DIPHENHYDRAMINE HCL 50 MG/ML IJ SOLN: 50.0000 mg | Freq: Once | INTRAMUSCULAR | Status: DC | PRN

## 2020-01-03 MED ORDER — SODIUM CHLORIDE 0.9 % IV SOLN: INTRAVENOUS | Status: DC | PRN

## 2020-01-03 MED ORDER — SODIUM CHLORIDE 0.9 % IV SOLN
100.0000 mg | Freq: Once | INTRAVENOUS | Status: AC
  Administered 2020-01-03: 100 mg via INTRAVENOUS
  Filled 2020-01-03: qty 20

## 2020-01-03 NOTE — Discharge Instructions (Signed)

## 2020-01-03 NOTE — Progress Notes (Addendum)
  Diagnosis: COVID-19  Physician: Asencion Noble, MD  Procedure: [facility]id Infusion Clinic Med: remdesivir infusion - Provided patient with remdesivir fact sheet for patients, parents and caregivers prior to infusion.  Complications: No immediate complications noted.  Discharge: Discharged home   Mary Oconnell 01/03/2020

## 2020-01-12 DIAGNOSIS — J1282 Pneumonia due to coronavirus disease 2019: Secondary | ICD-10-CM | POA: Diagnosis not present

## 2020-01-12 DIAGNOSIS — U071 COVID-19: Secondary | ICD-10-CM | POA: Diagnosis not present

## 2020-01-12 DIAGNOSIS — Z6828 Body mass index (BMI) 28.0-28.9, adult: Secondary | ICD-10-CM | POA: Diagnosis not present

## 2020-01-12 DIAGNOSIS — K219 Gastro-esophageal reflux disease without esophagitis: Secondary | ICD-10-CM | POA: Diagnosis not present

## 2020-01-30 ENCOUNTER — Other Ambulatory Visit: Payer: Self-pay

## 2020-01-30 ENCOUNTER — Other Ambulatory Visit (HOSPITAL_COMMUNITY): Payer: Self-pay | Admitting: Family Medicine

## 2020-01-30 ENCOUNTER — Ambulatory Visit (HOSPITAL_COMMUNITY)
Admission: RE | Admit: 2020-01-30 | Discharge: 2020-01-30 | Disposition: A | Discharge status: Home / Self Care | Payer: BC Managed Care – PPO | Source: Ambulatory Visit | Attending: Family Medicine | Admitting: Family Medicine

## 2020-01-30 DIAGNOSIS — J4521 Mild intermittent asthma with (acute) exacerbation: Secondary | ICD-10-CM | POA: Diagnosis not present

## 2020-01-30 DIAGNOSIS — R0600 Dyspnea, unspecified: Secondary | ICD-10-CM | POA: Insufficient documentation

## 2020-01-30 DIAGNOSIS — R609 Edema, unspecified: Secondary | ICD-10-CM | POA: Diagnosis not present

## 2020-01-30 DIAGNOSIS — J3089 Other allergic rhinitis: Secondary | ICD-10-CM | POA: Diagnosis not present

## 2020-01-30 DIAGNOSIS — J189 Pneumonia, unspecified organism: Secondary | ICD-10-CM | POA: Diagnosis not present

## 2020-01-30 DIAGNOSIS — U071 COVID-19: Secondary | ICD-10-CM | POA: Diagnosis not present

## 2020-01-30 DIAGNOSIS — Z6830 Body mass index (BMI) 30.0-30.9, adult: Secondary | ICD-10-CM | POA: Diagnosis not present

## 2020-03-05 DIAGNOSIS — Z6829 Body mass index (BMI) 29.0-29.9, adult: Secondary | ICD-10-CM | POA: Diagnosis not present

## 2020-03-05 DIAGNOSIS — F419 Anxiety disorder, unspecified: Secondary | ICD-10-CM | POA: Diagnosis not present

## 2020-03-05 DIAGNOSIS — M5412 Radiculopathy, cervical region: Secondary | ICD-10-CM | POA: Diagnosis not present

## 2020-03-05 DIAGNOSIS — M47812 Spondylosis without myelopathy or radiculopathy, cervical region: Secondary | ICD-10-CM | POA: Diagnosis not present

## 2020-03-05 DIAGNOSIS — I872 Venous insufficiency (chronic) (peripheral): Secondary | ICD-10-CM | POA: Diagnosis not present

## 2020-03-05 DIAGNOSIS — R600 Localized edema: Secondary | ICD-10-CM | POA: Diagnosis not present

## 2020-03-26 DIAGNOSIS — K219 Gastro-esophageal reflux disease without esophagitis: Secondary | ICD-10-CM | POA: Diagnosis not present

## 2020-03-26 DIAGNOSIS — R5383 Other fatigue: Secondary | ICD-10-CM | POA: Diagnosis not present

## 2020-03-26 DIAGNOSIS — E063 Autoimmune thyroiditis: Secondary | ICD-10-CM | POA: Diagnosis not present

## 2020-03-26 DIAGNOSIS — Z6828 Body mass index (BMI) 28.0-28.9, adult: Secondary | ICD-10-CM | POA: Diagnosis not present

## 2020-04-13 ENCOUNTER — Other Ambulatory Visit: Payer: Self-pay

## 2020-04-13 ENCOUNTER — Encounter: Payer: Self-pay | Admitting: Adult Health

## 2020-04-13 ENCOUNTER — Ambulatory Visit (INDEPENDENT_AMBULATORY_CARE_PROVIDER_SITE_OTHER): Payer: BC Managed Care – PPO | Admitting: Adult Health

## 2020-04-13 ENCOUNTER — Encounter: Payer: Self-pay | Admitting: Obstetrics & Gynecology

## 2020-04-13 VITALS — BP 128/60 | HR 68 | Ht 65.0 in | Wt 174.5 lb

## 2020-04-13 DIAGNOSIS — R5383 Other fatigue: Secondary | ICD-10-CM | POA: Diagnosis not present

## 2020-04-13 DIAGNOSIS — Z79899 Other long term (current) drug therapy: Secondary | ICD-10-CM

## 2020-04-13 DIAGNOSIS — Z1211 Encounter for screening for malignant neoplasm of colon: Secondary | ICD-10-CM | POA: Diagnosis not present

## 2020-04-13 DIAGNOSIS — Z79818 Long term (current) use of other agents affecting estrogen receptors and estrogen levels: Secondary | ICD-10-CM

## 2020-04-13 DIAGNOSIS — Z01419 Encounter for gynecological examination (general) (routine) without abnormal findings: Secondary | ICD-10-CM

## 2020-04-13 DIAGNOSIS — M2550 Pain in unspecified joint: Secondary | ICD-10-CM | POA: Diagnosis not present

## 2020-04-13 LAB — HEMOCCULT GUIAC POC 1CARD (OFFICE): Fecal Occult Blood, POC: NEGATIVE

## 2020-04-13 MED ORDER — ESTRADIOL 0.1 MG/24HR TD PTTW: MEDICATED_PATCH | TRANSDERMAL | 4 refills | Status: DC

## 2020-04-13 NOTE — Progress Notes (Signed)
Patient ID: Mary Oconnell, female   DOB: 1966-01-07, 54 y.o.   MRN: [id number] History of Present Illness: Mary Oconnell is a 54 year old white female,divorced, sp hysterectomy in for a well woman gyn exam,she is on vivelle dot patch.  She had COVID and spent 5 days on ICU at [facility], and she is still not back 100% yet.  PCP is Dr Gerarda Fraction.  Current Medications, Allergies, Past Medical History, Past Surgical History, Family History and Social History were reviewed in [facility] record.     Review of Systems:  Patient denies any headaches, hearing loss, fatigue, blurred vision, shortness of breath, chest pain, abdominal pain, problems with bowel movements, urination, or intercourse(not active). No joint pain or mood swings.   Physical Exam:BP 128/60 (BP Location: Right Arm, Patient Position: Sitting, Cuff Size: Normal)   Pulse 68   Ht 5\' 5"  (1.651 m)   Wt 174 lb 8 oz (79.2 kg)   BMI 29.04 kg/m  General:  Well developed, well nourished, no acute distress Skin:  Warm and dry Neck:  Midline trachea, normal thyroid, good ROM, no lymphadenopathy Lungs; Clear to auscultation bilaterally Breast:  No dominant palpable mass, retraction, or nipple discharge Cardiovascular: Regular rate and rhythm Abdomen:  Soft, non tender, no hepatosplenomegaly Pelvic:  External genitalia is normal in appearance, no lesions.  The vagina is normal in appearance. Urethra has no lesions or masses. The cervix and uterus are absent. No adnexal masses or tenderness noted.Bladder is non tender, no masses felt. Rectal: Good sphincter tone, no polyps, or hemorrhoids felt.  Hemoccult negative. Extremities/musculoskeletal:  No swelling or varicosities noted, no clubbing or cyanosis Psych:  No mood changes, alert and cooperative,seems happy AA is 1 Fall risk is low PHQ 9 score is 1  Upstream - 04/13/20 1612      Pregnancy Intention Screening   Does the patient want to become pregnant in the next year? N/A     Does the patient's partner want to become pregnant in the next year? N/A    Would the patient like to discuss contraceptive options today? N/A      Contraception Wrap Up   Current Method No Method - Other Reason   hyst   End Method No Method - Other Reason   hyst   Contraception Counseling Provided No         Examination chaperoned by Levy Pupa LPN.  Impression and Plan:  1. Encounter for well woman exam with routine gynecological exam Physical in 1 year Labs today for PCP Mammogram yearly  Colonoscopy per GI   2. Encounter for screening fecal occult blood testing   3. Current use of estrogen therapy Meds ordered this encounter  Medications  . estradiol (VIVELLE-DOT) 0.1 MG/24HR patch    Sig: APPLY 1 PATCH EXTERNALLY TO THE SKIN 2 TIMES A WEEK    Dispense:  24 patch    Refill:  4    Order Specific Question:   Supervising Provider    Answer:   Tania Ade H [[id number]]

## 2020-05-29 ENCOUNTER — Other Ambulatory Visit: Payer: Self-pay | Admitting: Adult Health

## 2020-07-05 ENCOUNTER — Other Ambulatory Visit: Payer: Self-pay | Admitting: Adult Health

## 2020-07-05 MED ORDER — METRONIDAZOLE 0.75 % VA GEL: 1.0000 | Freq: Every day | VAGINAL | 0 refills | Status: DC

## 2020-07-05 NOTE — Progress Notes (Signed)
Will rx metrogel  

## 2020-07-12 ENCOUNTER — Other Ambulatory Visit (HOSPITAL_COMMUNITY): Payer: Self-pay | Admitting: Internal Medicine

## 2020-07-12 DIAGNOSIS — Z1331 Encounter for screening for depression: Secondary | ICD-10-CM | POA: Diagnosis not present

## 2020-07-12 DIAGNOSIS — E063 Autoimmune thyroiditis: Secondary | ICD-10-CM | POA: Diagnosis not present

## 2020-07-12 DIAGNOSIS — M1991 Primary osteoarthritis, unspecified site: Secondary | ICD-10-CM | POA: Diagnosis not present

## 2020-07-12 DIAGNOSIS — Z1231 Encounter for screening mammogram for malignant neoplasm of breast: Secondary | ICD-10-CM

## 2020-07-12 DIAGNOSIS — K219 Gastro-esophageal reflux disease without esophagitis: Secondary | ICD-10-CM | POA: Diagnosis not present

## 2020-07-12 DIAGNOSIS — M47816 Spondylosis without myelopathy or radiculopathy, lumbar region: Secondary | ICD-10-CM | POA: Diagnosis not present

## 2020-07-12 DIAGNOSIS — Z6829 Body mass index (BMI) 29.0-29.9, adult: Secondary | ICD-10-CM | POA: Diagnosis not present

## 2020-07-12 DIAGNOSIS — M5410 Radiculopathy, site unspecified: Secondary | ICD-10-CM | POA: Diagnosis not present

## 2020-09-29 ENCOUNTER — Other Ambulatory Visit: Payer: Self-pay | Admitting: Adult Health

## 2020-10-08 ENCOUNTER — Ambulatory Visit: Payer: BC Managed Care – PPO

## 2020-11-16 DIAGNOSIS — Z13220 Encounter for screening for lipoid disorders: Secondary | ICD-10-CM | POA: Diagnosis not present

## 2020-11-16 DIAGNOSIS — R4182 Altered mental status, unspecified: Secondary | ICD-10-CM | POA: Diagnosis not present

## 2020-11-16 DIAGNOSIS — Z6829 Body mass index (BMI) 29.0-29.9, adult: Secondary | ICD-10-CM | POA: Diagnosis not present

## 2020-11-16 DIAGNOSIS — E538 Deficiency of other specified B group vitamins: Secondary | ICD-10-CM | POA: Diagnosis not present

## 2020-11-16 DIAGNOSIS — E663 Overweight: Secondary | ICD-10-CM | POA: Diagnosis not present

## 2020-11-16 DIAGNOSIS — E063 Autoimmune thyroiditis: Secondary | ICD-10-CM | POA: Diagnosis not present

## 2020-11-16 DIAGNOSIS — M1991 Primary osteoarthritis, unspecified site: Secondary | ICD-10-CM | POA: Diagnosis not present

## 2020-11-16 DIAGNOSIS — M47816 Spondylosis without myelopathy or radiculopathy, lumbar region: Secondary | ICD-10-CM | POA: Diagnosis not present

## 2020-11-16 DIAGNOSIS — E559 Vitamin D deficiency, unspecified: Secondary | ICD-10-CM | POA: Diagnosis not present

## 2020-11-16 DIAGNOSIS — E782 Mixed hyperlipidemia: Secondary | ICD-10-CM | POA: Diagnosis not present

## 2020-11-26 ENCOUNTER — Other Ambulatory Visit (HOSPITAL_COMMUNITY): Payer: Self-pay | Admitting: Family Medicine

## 2020-11-26 ENCOUNTER — Ambulatory Visit (HOSPITAL_COMMUNITY)
Admission: RE | Admit: 2020-11-26 | Discharge: 2020-11-26 | Disposition: A | Discharge status: Home / Self Care | Payer: BC Managed Care – PPO | Source: Ambulatory Visit | Attending: Family Medicine | Admitting: Family Medicine

## 2020-11-26 ENCOUNTER — Other Ambulatory Visit: Payer: Self-pay

## 2020-11-26 ENCOUNTER — Other Ambulatory Visit: Payer: Self-pay | Admitting: Family Medicine

## 2020-11-26 DIAGNOSIS — E663 Overweight: Secondary | ICD-10-CM | POA: Diagnosis not present

## 2020-11-26 DIAGNOSIS — M79605 Pain in left leg: Secondary | ICD-10-CM | POA: Diagnosis not present

## 2020-11-26 DIAGNOSIS — E039 Hypothyroidism, unspecified: Secondary | ICD-10-CM | POA: Diagnosis not present

## 2020-11-26 DIAGNOSIS — R600 Localized edema: Secondary | ICD-10-CM | POA: Diagnosis not present

## 2020-11-26 DIAGNOSIS — M7989 Other specified soft tissue disorders: Secondary | ICD-10-CM | POA: Diagnosis not present

## 2020-11-26 DIAGNOSIS — R2242 Localized swelling, mass and lump, left lower limb: Secondary | ICD-10-CM | POA: Diagnosis not present

## 2020-11-26 DIAGNOSIS — Z6829 Body mass index (BMI) 29.0-29.9, adult: Secondary | ICD-10-CM | POA: Diagnosis not present

## 2020-12-01 ENCOUNTER — Ambulatory Visit
Admission: RE | Admit: 2020-12-01 | Discharge: 2020-12-01 | Disposition: A | Discharge status: Home / Self Care | Payer: BC Managed Care – PPO | Source: Ambulatory Visit | Attending: Internal Medicine | Admitting: Internal Medicine

## 2020-12-01 ENCOUNTER — Other Ambulatory Visit: Payer: Self-pay

## 2020-12-01 DIAGNOSIS — Z1231 Encounter for screening mammogram for malignant neoplasm of breast: Secondary | ICD-10-CM

## 2021-01-06 DIAGNOSIS — E6609 Other obesity due to excess calories: Secondary | ICD-10-CM | POA: Diagnosis not present

## 2021-01-06 DIAGNOSIS — Z6830 Body mass index (BMI) 30.0-30.9, adult: Secondary | ICD-10-CM | POA: Diagnosis not present

## 2021-01-06 DIAGNOSIS — E039 Hypothyroidism, unspecified: Secondary | ICD-10-CM | POA: Diagnosis not present

## 2021-02-02 DIAGNOSIS — H6980 Other specified disorders of Eustachian tube, unspecified ear: Secondary | ICD-10-CM | POA: Diagnosis not present

## 2021-02-02 DIAGNOSIS — J22 Unspecified acute lower respiratory infection: Secondary | ICD-10-CM | POA: Diagnosis not present

## 2021-02-02 DIAGNOSIS — F419 Anxiety disorder, unspecified: Secondary | ICD-10-CM | POA: Diagnosis not present

## 2021-02-21 DIAGNOSIS — J019 Acute sinusitis, unspecified: Secondary | ICD-10-CM | POA: Diagnosis not present

## 2021-02-22 ENCOUNTER — Other Ambulatory Visit (HOSPITAL_COMMUNITY): Payer: Self-pay | Admitting: Family Medicine

## 2021-02-23 ENCOUNTER — Other Ambulatory Visit (HOSPITAL_COMMUNITY): Payer: Self-pay | Admitting: Family Medicine

## 2021-02-23 DIAGNOSIS — N6320 Unspecified lump in the left breast, unspecified quadrant: Secondary | ICD-10-CM

## 2021-03-22 ENCOUNTER — Encounter (HOSPITAL_COMMUNITY): Payer: Self-pay

## 2021-03-22 ENCOUNTER — Ambulatory Visit (HOSPITAL_COMMUNITY): Payer: BC Managed Care – PPO

## 2021-03-22 ENCOUNTER — Encounter (HOSPITAL_COMMUNITY): Payer: BC Managed Care – PPO

## 2021-06-22 ENCOUNTER — Other Ambulatory Visit: Payer: BC Managed Care – PPO

## 2021-06-22 ENCOUNTER — Other Ambulatory Visit: Payer: Self-pay | Admitting: Adult Health

## 2021-06-22 MED ORDER — METRONIDAZOLE 0.75 % VA GEL: 1.0000 | Freq: Every day | VAGINAL | 0 refills | Status: DC

## 2021-06-22 NOTE — Telephone Encounter (Signed)
Refilled metrogel 

## 2021-06-23 ENCOUNTER — Other Ambulatory Visit: Payer: Self-pay | Admitting: Adult Health

## 2021-07-12 ENCOUNTER — Ambulatory Visit: Payer: BC Managed Care – PPO | Admitting: Adult Health

## 2021-10-09 ENCOUNTER — Other Ambulatory Visit: Payer: Self-pay | Admitting: Adult Health

## 2022-03-21 DIAGNOSIS — K2970 Gastritis, unspecified, without bleeding: Secondary | ICD-10-CM | POA: Diagnosis not present

## 2022-03-21 DIAGNOSIS — Z6824 Body mass index (BMI) 24.0-24.9, adult: Secondary | ICD-10-CM | POA: Diagnosis not present

## 2022-04-10 DIAGNOSIS — Z6824 Body mass index (BMI) 24.0-24.9, adult: Secondary | ICD-10-CM | POA: Diagnosis not present

## 2022-04-10 DIAGNOSIS — E063 Autoimmune thyroiditis: Secondary | ICD-10-CM | POA: Diagnosis not present

## 2022-04-10 DIAGNOSIS — Z0001 Encounter for general adult medical examination with abnormal findings: Secondary | ICD-10-CM | POA: Diagnosis not present

## 2022-04-10 DIAGNOSIS — K219 Gastro-esophageal reflux disease without esophagitis: Secondary | ICD-10-CM | POA: Diagnosis not present

## 2022-04-10 DIAGNOSIS — Z1331 Encounter for screening for depression: Secondary | ICD-10-CM | POA: Diagnosis not present

## 2022-04-10 DIAGNOSIS — M1991 Primary osteoarthritis, unspecified site: Secondary | ICD-10-CM | POA: Diagnosis not present

## 2022-04-25 ENCOUNTER — Ambulatory Visit: Payer: BC Managed Care – PPO | Admitting: Adult Health

## 2022-05-09 ENCOUNTER — Other Ambulatory Visit: Payer: Self-pay | Admitting: Adult Health

## 2022-05-28 IMAGING — DX DG LUMBAR SPINE 2-3V
3 series · 3 of 3 positions shown · non-contrast
Comparison: 09/21/2015

CLINICAL DATA: Radiculopathy

EXAM:
LUMBAR SPINE - 2-3 VIEW

[l-spine ap]
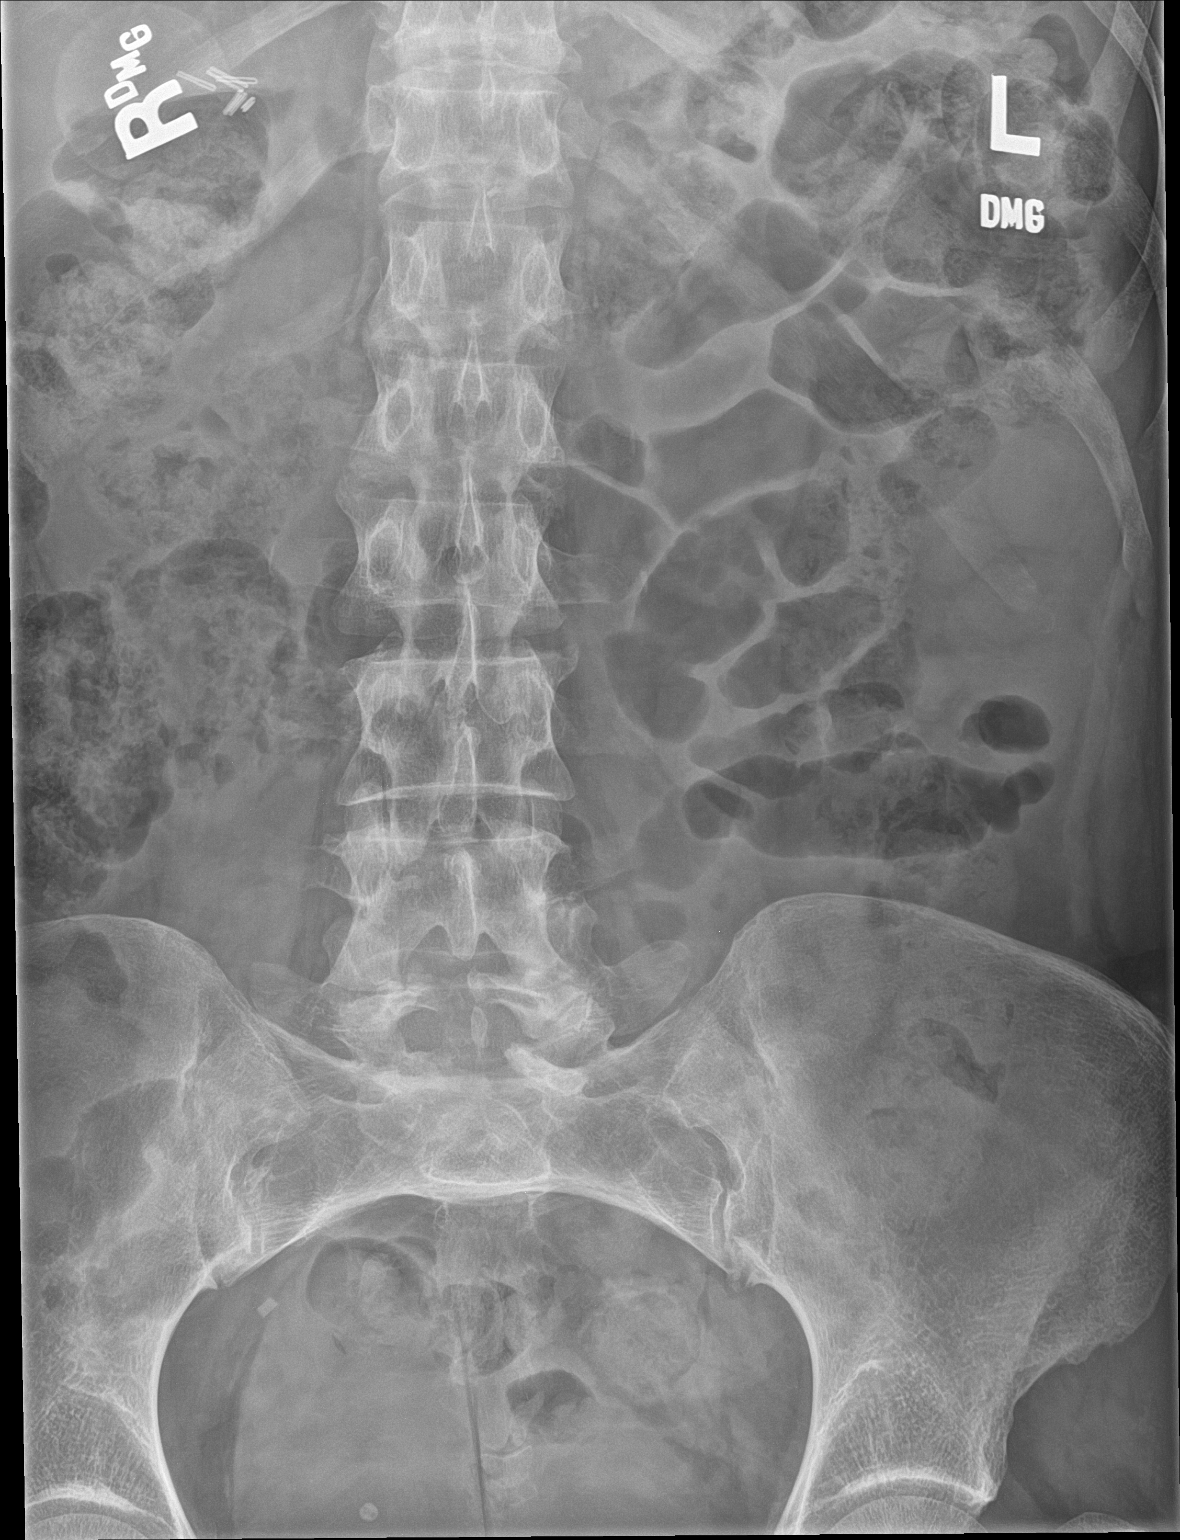

[l-spine lat (1 of 2)]
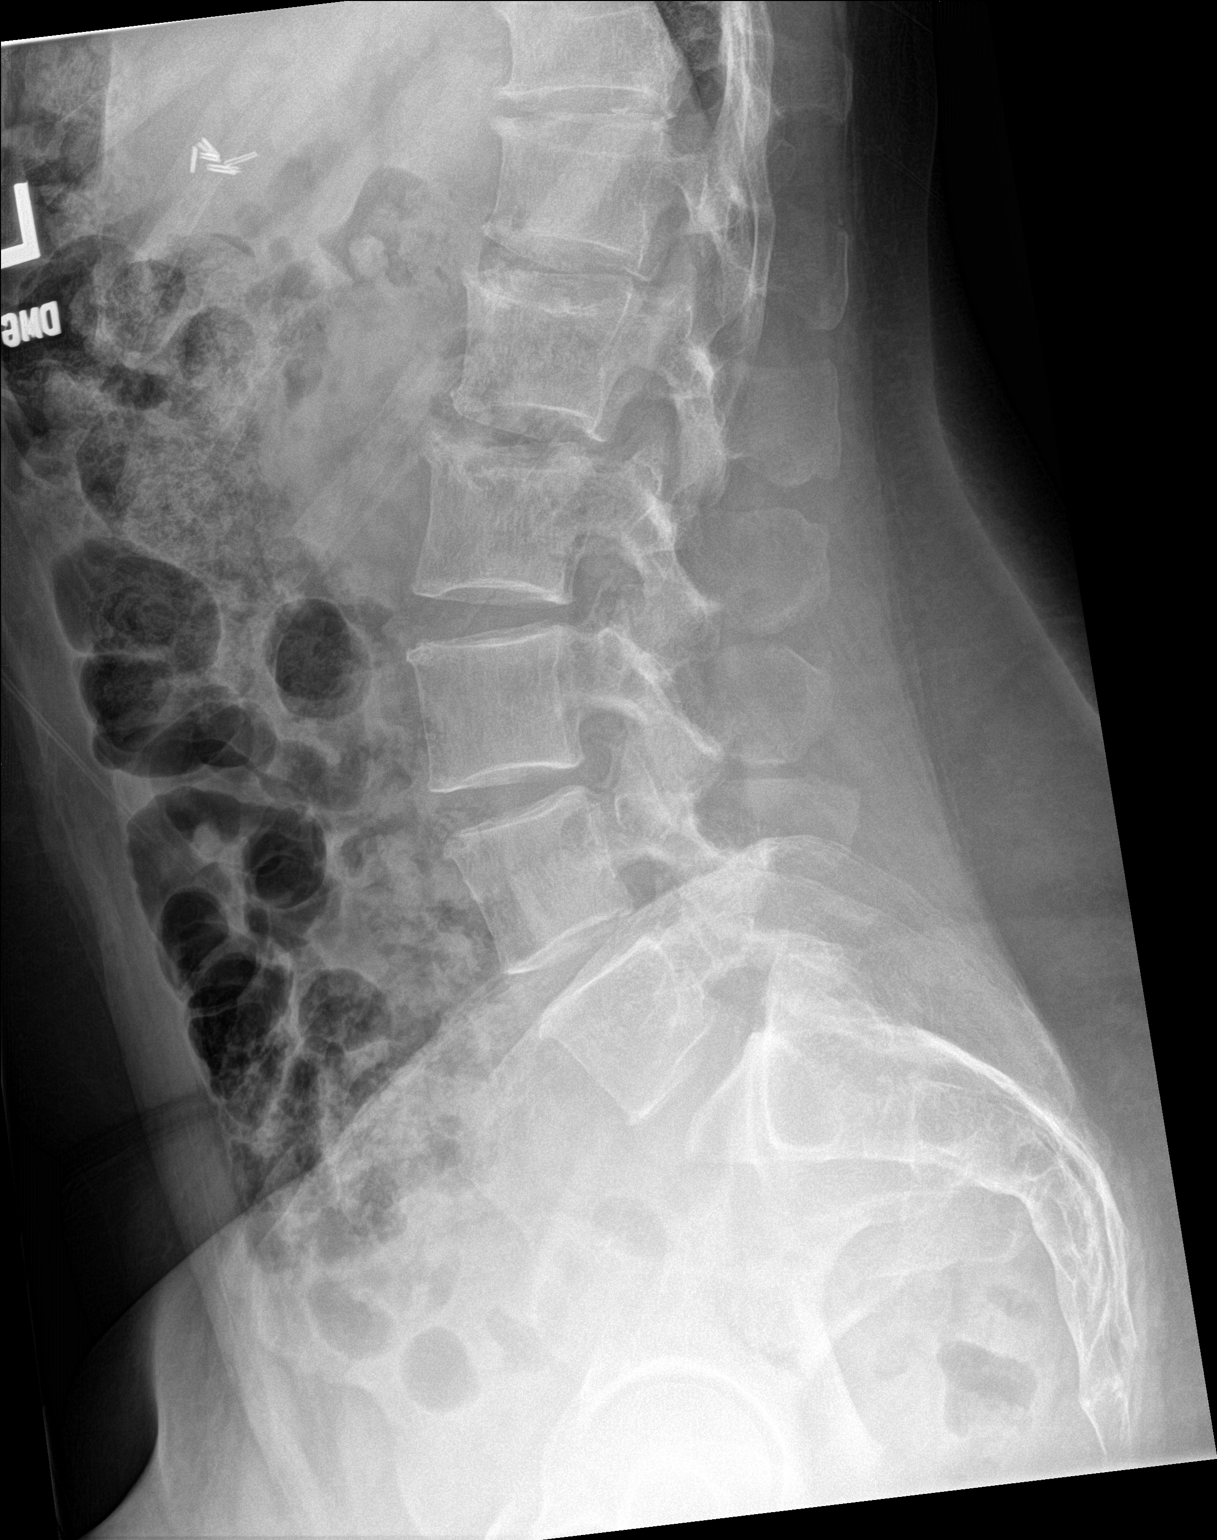

[l-spine lat (2 of 2)]
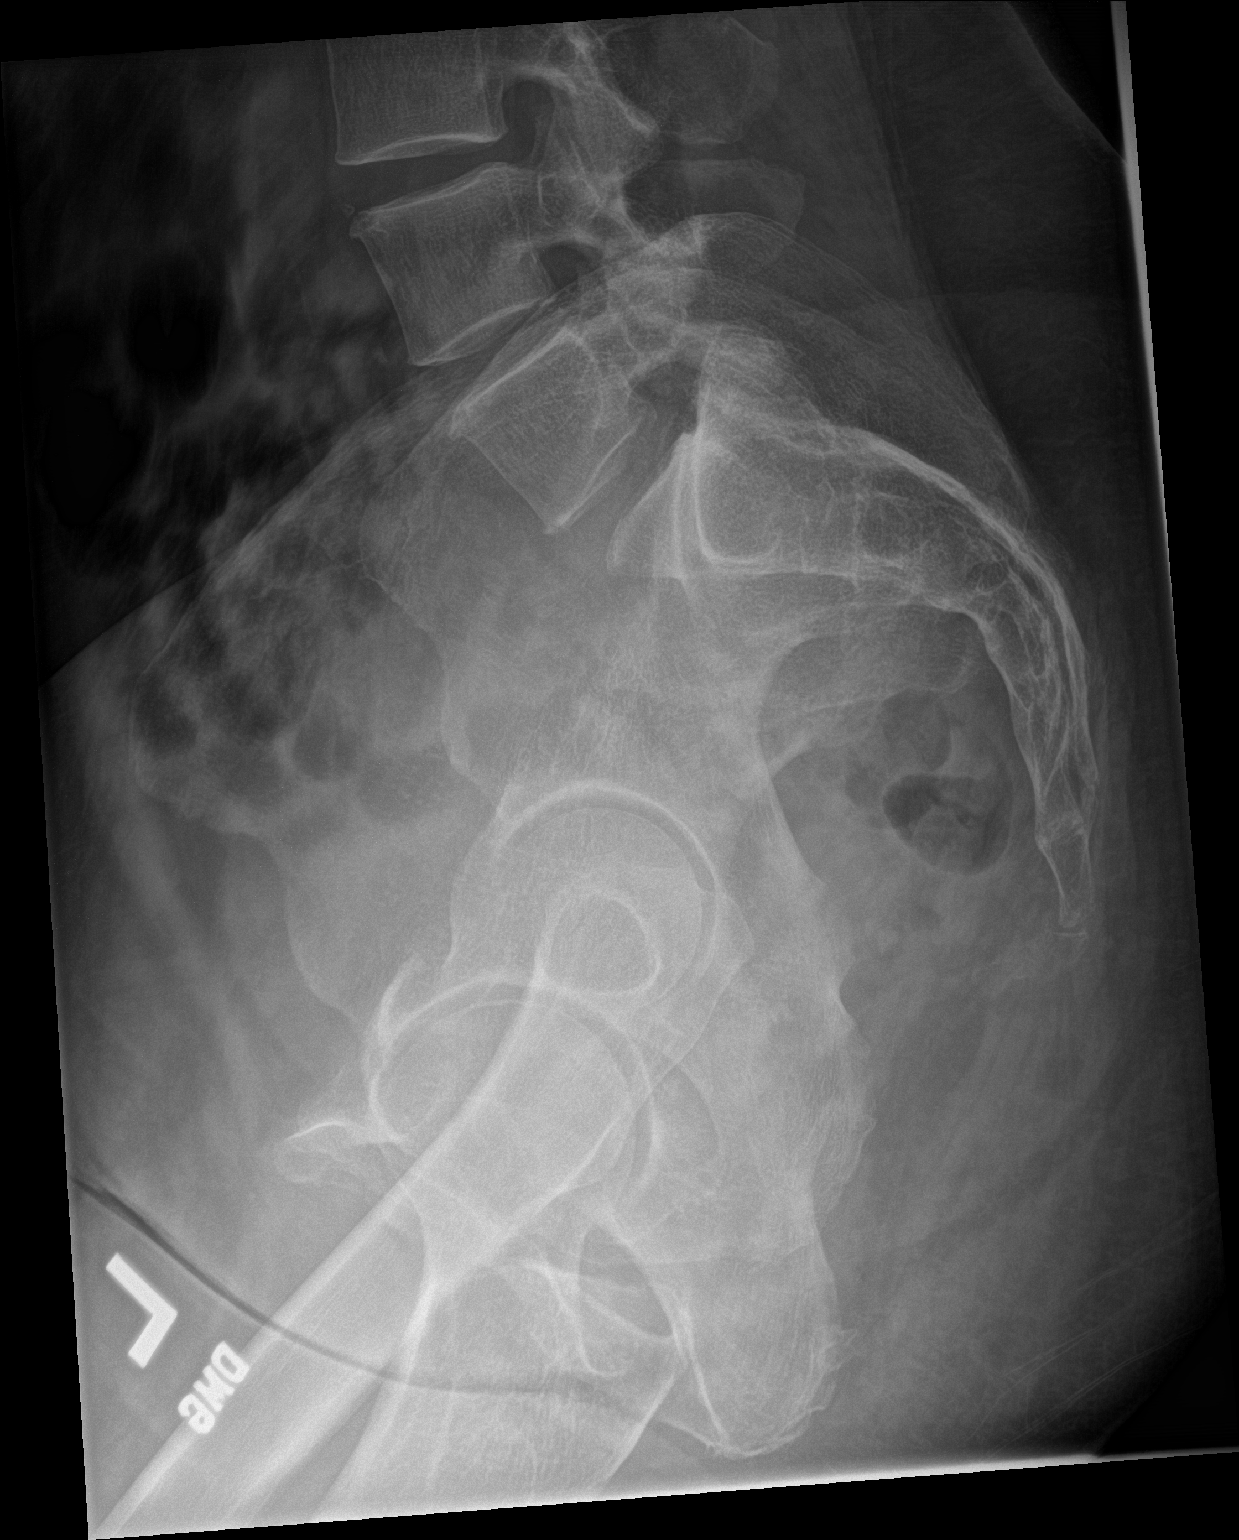

[3 of 3 positions shown; findings below may reference images not displayed]

FINDINGS: Lumbar alignment within normal limits. Suspected transitional
anatomy. For the purposes of reporting, lumbar numbering will be
consistent with comparison 0204 study. Transitional vertebra will be
lumbarized S1. Minimal endplate deformities at L2 and L3, chronic.
Mild degenerative change at L2-L3. Remaining disc spaces are
maintained.
IMPRESSION: Suspected transitional anatomy. There are mild degenerative changes
at L1-L2 and L2-L3 without significant change.

## 2022-06-05 ENCOUNTER — Encounter: Payer: Self-pay | Admitting: Adult Health

## 2022-06-05 ENCOUNTER — Ambulatory Visit (INDEPENDENT_AMBULATORY_CARE_PROVIDER_SITE_OTHER): Payer: BC Managed Care – PPO | Admitting: Adult Health

## 2022-06-05 ENCOUNTER — Other Ambulatory Visit (HOSPITAL_COMMUNITY): Payer: Self-pay | Admitting: Adult Health

## 2022-06-05 VITALS — BP 128/74 | HR 66 | Ht 65.5 in | Wt 153.0 lb

## 2022-06-05 DIAGNOSIS — Z9223 Personal history of estrogen therapy: Secondary | ICD-10-CM | POA: Diagnosis not present

## 2022-06-05 DIAGNOSIS — Z01419 Encounter for gynecological examination (general) (routine) without abnormal findings: Secondary | ICD-10-CM | POA: Diagnosis not present

## 2022-06-05 DIAGNOSIS — Z1211 Encounter for screening for malignant neoplasm of colon: Secondary | ICD-10-CM

## 2022-06-05 DIAGNOSIS — N941 Unspecified dyspareunia: Secondary | ICD-10-CM | POA: Insufficient documentation

## 2022-06-05 DIAGNOSIS — N6321 Unspecified lump in the left breast, upper outer quadrant: Secondary | ICD-10-CM

## 2022-06-05 DIAGNOSIS — Z90710 Acquired absence of both cervix and uterus: Secondary | ICD-10-CM | POA: Insufficient documentation

## 2022-06-05 DIAGNOSIS — N951 Menopausal and female climacteric states: Secondary | ICD-10-CM

## 2022-06-05 DIAGNOSIS — N6320 Unspecified lump in the left breast, unspecified quadrant: Secondary | ICD-10-CM

## 2022-06-05 LAB — HEMOCCULT GUIAC POC 1CARD (OFFICE): Fecal Occult Blood, POC: NEGATIVE

## 2022-06-05 MED ORDER — IBUPROFEN 800 MG PO TABS: 800.0000 mg | ORAL_TABLET | Freq: Three times a day (TID) | ORAL | 6 refills | Status: DC | PRN

## 2022-06-05 MED ORDER — ESTRADIOL 0.1 MG/24HR TD PTTW: MEDICATED_PATCH | TRANSDERMAL | 4 refills | Status: DC

## 2022-06-05 MED ORDER — ESTRADIOL 0.1 MG/GM VA CREA: TOPICAL_CREAM | VAGINAL | 2 refills | Status: DC

## 2022-06-05 MED ORDER — VALACYCLOVIR HCL 1 G PO TABS: 1000.0000 mg | ORAL_TABLET | Freq: Every day | ORAL | 12 refills | Status: DC

## 2022-06-05 NOTE — Progress Notes (Signed)
Patient ID: Mary Oconnell, female   DOB: Sep 15, 1965, 57 y.o.   MRN: [id number] History of Present Illness: Mary Oconnell is a 57 year old white female, divorced, sp hysterectomy in for a well woman gyn exam and is complaining of being off estrogen patch for 3 weeks(ran out) has hot flashes, vaginal dryness and pain with sex, and has noticed left breast mass. She requests refill on patches, ibuprofen and valtrex.  PCP is Dr Gerarda Fraction.   Current Medications, Allergies, Past Medical History, Past Surgical History, Family History and Social History were reviewed in [facility] record.     Review of Systems: Patient denies any headaches, hearing loss, fatigue, blurred vision, shortness of breath, chest pain, abdominal pain, problems with bowel movements, urination. No joint pain or mood swings.  See HPI for positives.   Physical Exam:BP 128/74 (BP Location: Right Arm, Patient Position: Sitting, Cuff Size: Normal)   Pulse 66   Ht 5' 5.5" (1.664 m)   Wt 153 lb (69.4 kg)   BMI 25.07 kg/m   General:  Well developed, well nourished, no acute distress Skin:  Warm and dry Neck:  Midline trachea, normal thyroid, good ROM, no lymphadenopathy Lungs; Clear to auscultation bilaterally Breast:  No dominant palpable mass, retraction, or nipple discharge on the right, on the left has mass 1 FB from nipple, 12-1 0' clock, mobil and tender. Cardiovascular: Regular rate and rhythm Abdomen:  Soft, non tender, no hepatosplenomegaly Pelvic:  External genitalia is normal in appearance, no lesions.  The vagina is pale. Urethra has no lesions or masses. The cervix and uterus are absent.  No adnexal masses or tenderness noted.Bladder is non tender, no masses felt. Rectal: Good sphincter tone, no polyps, or hemorrhoids felt.  Hemoccult negative. Extremities/musculoskeletal:  No swelling or varicosities noted, no clubbing or cyanosis Psych:  No mood changes, alert and cooperative,seems happy AA is 2 Fall  risk is low    06/05/2022    1:31 PM 04/13/2020    4:10 PM 04/09/2019   10:06 AM  Depression screen PHQ 2/9  Decreased Interest 0 0 0  Down, Depressed, Hopeless 0 0 0  PHQ - 2 Score 0 0 0  Altered sleeping 0 0 0  Tired, decreased energy 0 1 0  Change in appetite 0 0 0  Feeling bad or failure about yourself  0 0 0  Trouble concentrating 0 0 0  Moving slowly or fidgety/restless 0 0 0  Suicidal thoughts 0 0 0  PHQ-9 Score 0 1 0       06/05/2022    1:31 PM 04/13/2020    4:10 PM  GAD 7 : Generalized Anxiety Score  Nervous, Anxious, on Edge 0 0  Control/stop worrying 0 0  Worry too much - different things 0 0  Trouble relaxing 0 0  Restless 0 0  Easily annoyed or irritable 0 0  Afraid - awful might happen 0 0  Total GAD 7 Score 0 0    Upstream - 06/05/22 1336       Pregnancy Intention Screening   Does the patient want to become pregnant in the next year? N/A    Does the patient's partner want to become pregnant in the next year? N/A    Would the patient like to discuss contraceptive options today? N/A      Contraception Wrap Up   Current Method Female Sterilization   hyst   End Method Female Sterilization   hyst   Contraception Counseling Provided  No            Examination chaperoned by Levy Pupa LPN    Impression and Plan: 1. Encounter for well woman exam with routine gynecological exam Physical in 1 year Labs with PCP Will refill ibuprofen and valtrex  2. Encounter for screening fecal occult blood testing Hemoccult was negative   3. Mass of upper outer quadrant of left breast Diagnostic bilateral mammogram and left breast US scheduled at [facility] 06/27/22 at 10 am   4. H/O estrogen therapy Will refill Vivelle-dot 0.1 mg patch  Meds ordered this encounter  Medications   valACYclovir (VALTREX) 1000 MG tablet    Sig: Take 1 tablet (1,000 mg total) by mouth daily.    Dispense:  30 tablet    Refill:  12    Order Specific Question:   Supervising  Provider    Answer:   Tania Ade H [[id number]]   estradiol (VIVELLE-DOT) 0.1 MG/24HR patch    Sig: Apply 1 patch topically to the skin 2 x weekly    Dispense:  24 patch    Refill:  4    Order Specific Question:   Supervising Provider    Answer:   Tania Ade H [[id number]]   estradiol (ESTRACE VAGINAL) 0.1 MG/GM vaginal cream    Sig: Use 0.5 gm in vaginal nightly for 2 weeks then 2 x weekly    Dispense:  42.5 g    Refill:  2    Order Specific Question:   Supervising Provider    Answer:   Tania Ade H [[id number]]   ibuprofen (ADVIL) 800 MG tablet    Sig: Take 1 tablet (800 mg total) by mouth every 8 (eight) hours as needed.    Dispense:  30 tablet    Refill:  6    Order Specific Question:   Supervising Provider    Answer:   Elonda Husky, LUTHER H [[id number]]     5. Vaginal dryness, menopausal Will add estrace vaginal cream 0.5 gm nightly for 2 weeks then 2 x weekly She will let me know if helps  6. Dyspareunia, female Use estrace vaginal cream 0.5 gm nightly for 2 weeks before having sex Can try astroglide with sex, or coconut or olive oil   7. S/P hysterectomy

## 2022-06-27 ENCOUNTER — Ambulatory Visit (HOSPITAL_COMMUNITY)
Admission: RE | Admit: 2022-06-27 | Discharge: 2022-06-27 | Disposition: A | Discharge status: Home / Self Care | Payer: BC Managed Care – PPO | Source: Ambulatory Visit | Attending: Adult Health | Admitting: Adult Health

## 2022-06-27 ENCOUNTER — Encounter (HOSPITAL_COMMUNITY): Payer: Self-pay

## 2022-06-27 DIAGNOSIS — N6320 Unspecified lump in the left breast, unspecified quadrant: Secondary | ICD-10-CM | POA: Insufficient documentation

## 2022-06-27 DIAGNOSIS — N6002 Solitary cyst of left breast: Secondary | ICD-10-CM | POA: Diagnosis not present

## 2022-06-27 DIAGNOSIS — N6321 Unspecified lump in the left breast, upper outer quadrant: Secondary | ICD-10-CM

## 2022-06-27 DIAGNOSIS — N6001 Solitary cyst of right breast: Secondary | ICD-10-CM | POA: Diagnosis not present

## 2022-07-05 IMAGING — CT CT ABD-PELV W/O CM
2 of 4 series · 17 of 46 positions shown, 19 images · non-contrast
Comparison: June 03, 2018.

CLINICAL DATA: Acute left lower quadrant abdominal pain.

EXAM:
CT ABDOMEN AND PELVIS WITHOUT CONTRAST
TECHNIQUE: Multidetector CT imaging of the abdomen and pelvis was performed
following the standard protocol without IV contrast.

[Series 2: axial st · axial · 0.71mm/px · z∈[+608,+1028]mm · 14 of 94 slices shown, 16 images]
[im 5/94  soft-tissue]
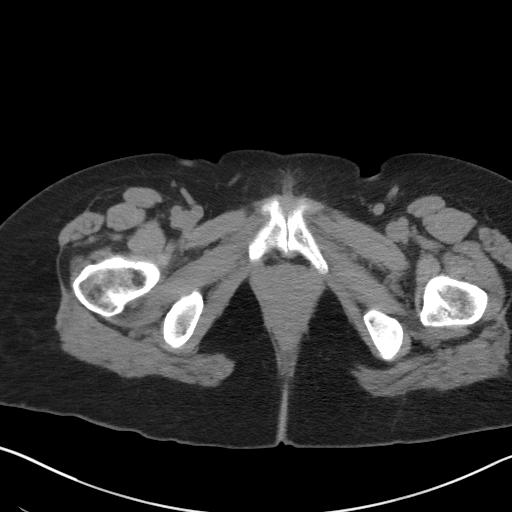
[im 5/94  bone]
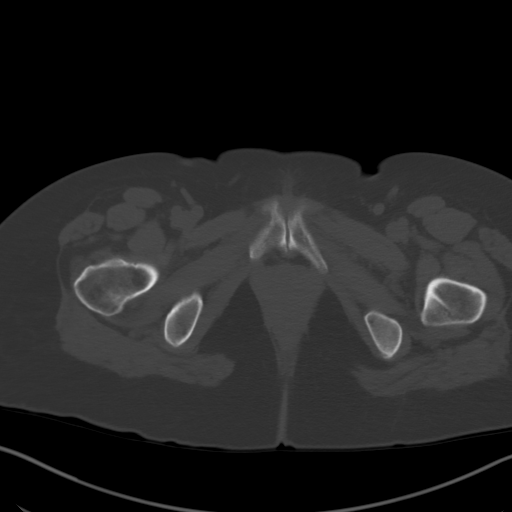
[im 10/94  soft-tissue]
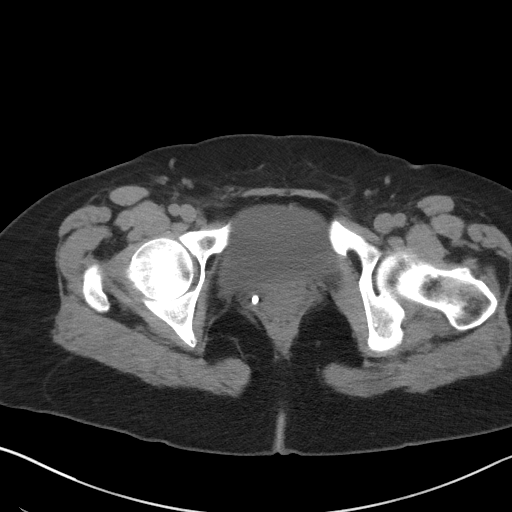
[im 20/94  soft-tissue]
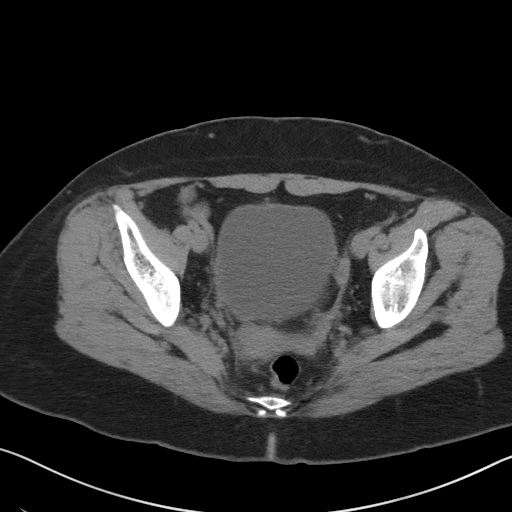
[im 25/94  soft-tissue]
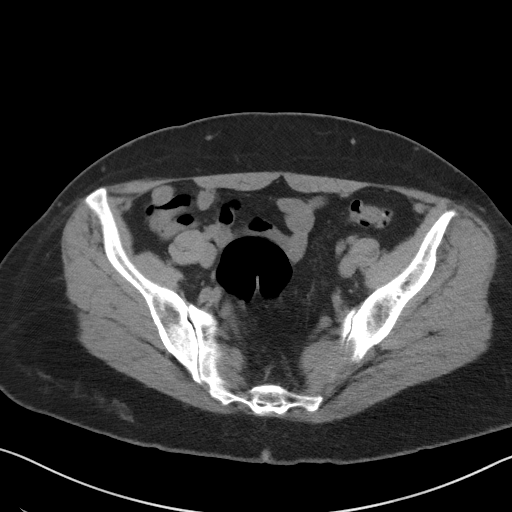
[im 30/94  soft-tissue]
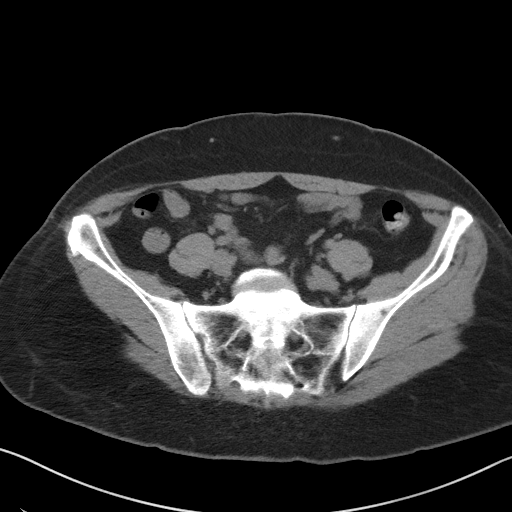
[im 40/94  soft-tissue]
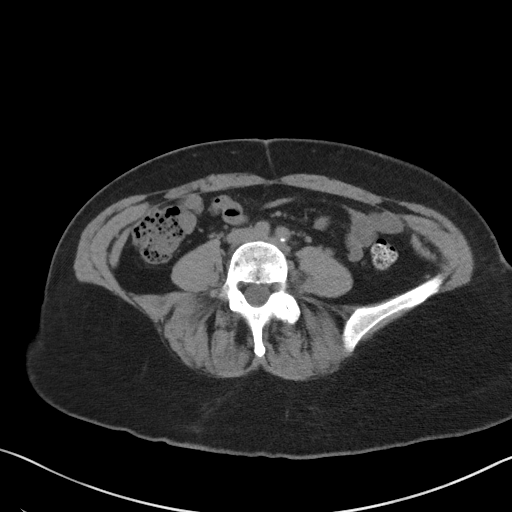
[im 45/94  soft-tissue]
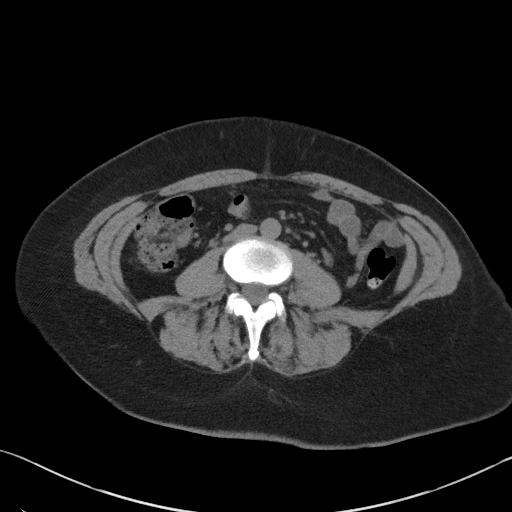
[im 49/94  soft-tissue]
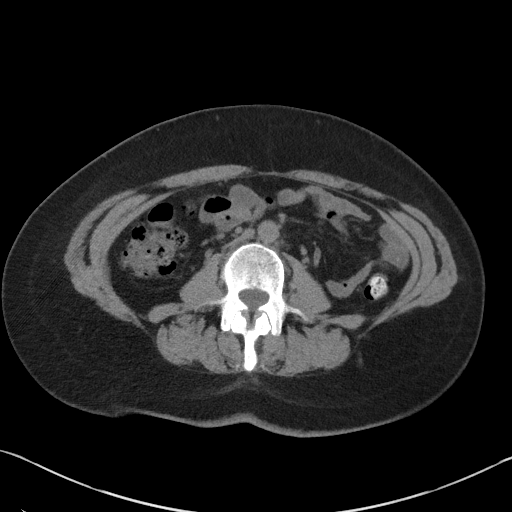
[im 54/94  soft-tissue]
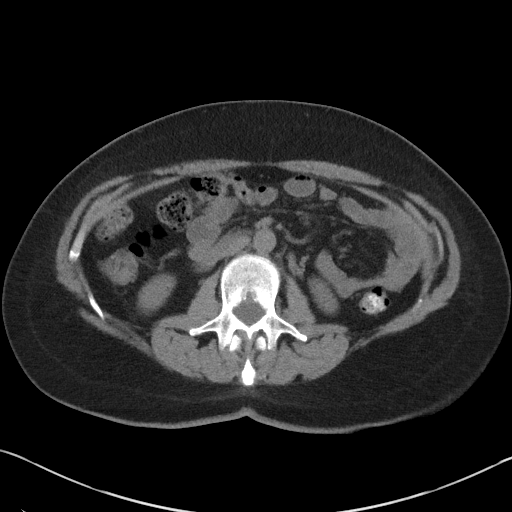
[im 54/94  bone]
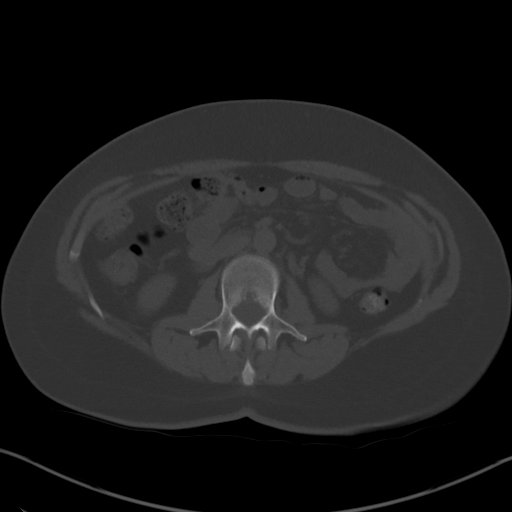
[im 64/94  soft-tissue]
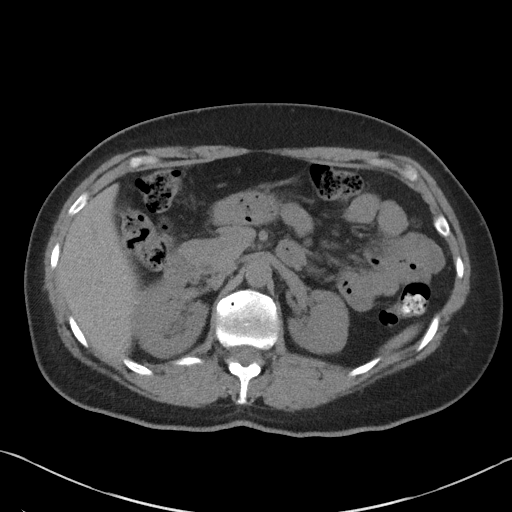
[im 69/94  soft-tissue]
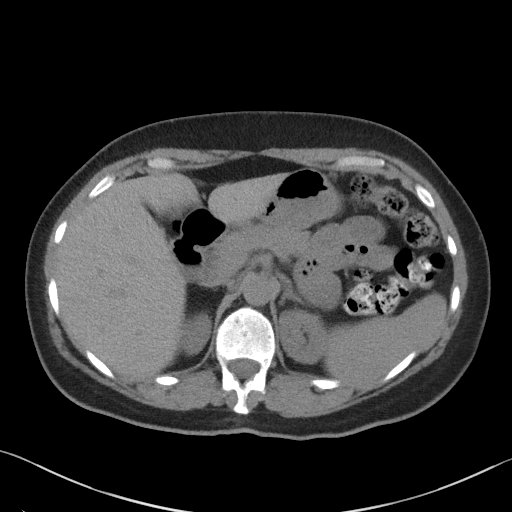
[im 74/94  soft-tissue]
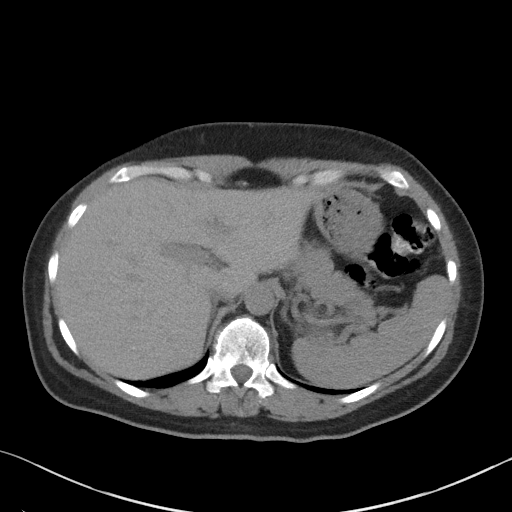
[im 84/94  soft-tissue]
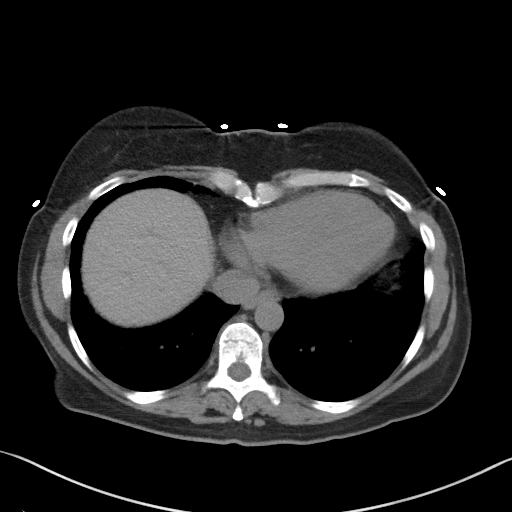
[im 89/94  soft-tissue]
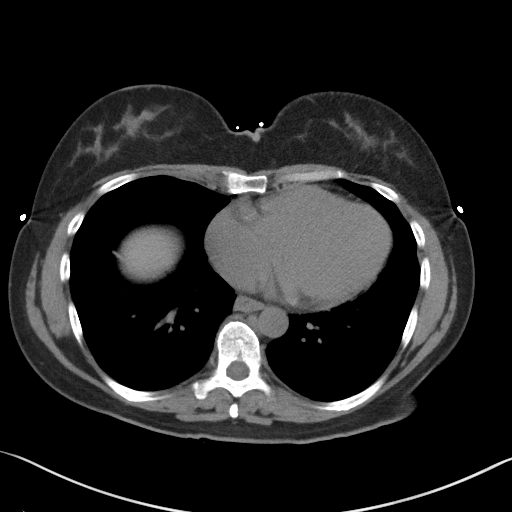

[Series 5: coronal st · coronal · 0.82mm/px · 3 of 91 slices shown]
[im 31/91  soft-tissue]
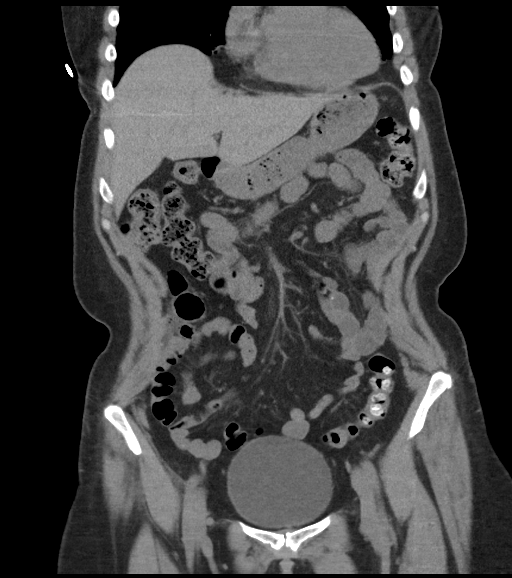
[im 41/91  soft-tissue]
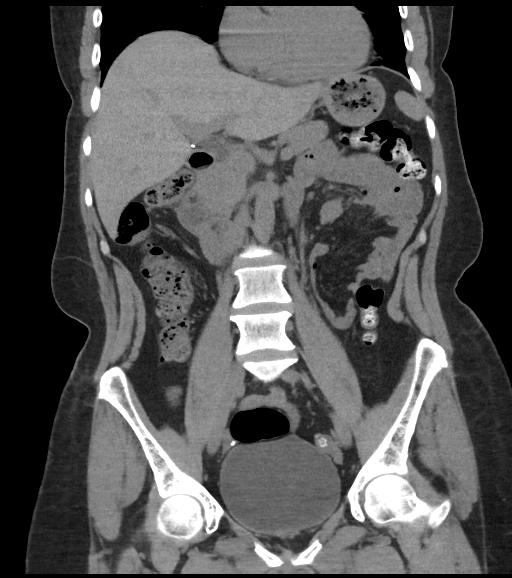
[im 51/91  soft-tissue]
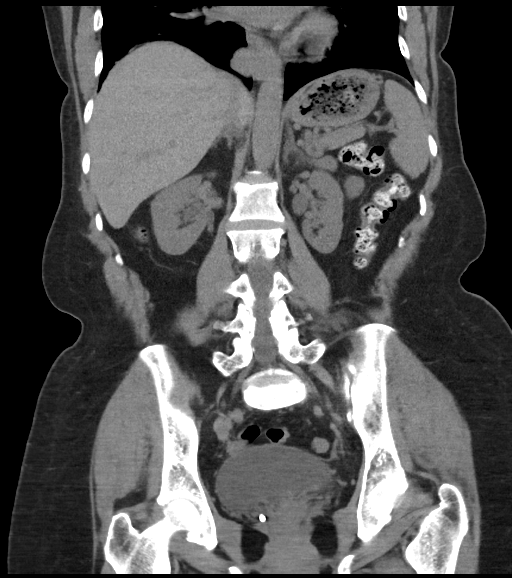

[17 of 46 positions shown; findings below may reference images not displayed]

FINDINGS: Lower chest: No acute abnormality.

Hepatobiliary: No focal liver abnormality is seen. Status post
cholecystectomy. No biliary dilatation.

Pancreas: Unremarkable. No pancreatic ductal dilatation or
surrounding inflammatory changes.

Spleen: Normal in size without focal abnormality.

Adrenals/Urinary Tract: Adrenal glands are unremarkable. Small
nonobstructive right renal calculus is noted. No hydronephrosis or
renal obstruction is noted. Bladder is unremarkable.

Stomach/Bowel: Stomach is within normal limits. Appendix appears
normal. No evidence of bowel wall thickening, distention, or
inflammatory changes.

Vascular/Lymphatic: No significant vascular findings are present. No
enlarged abdominal or pelvic lymph nodes.

Reproductive: Status post hysterectomy. No adnexal masses.

Other: No abdominal wall hernia or abnormality. No abdominopelvic
ascites.

Musculoskeletal: No acute or significant osseous findings.
IMPRESSION: 1. Small nonobstructive right renal calculus. No hydronephrosis or
renal obstruction is noted.
2. No other abnormality seen in the abdomen or pelvis.

## 2023-01-03 DIAGNOSIS — R6889 Other general symptoms and signs: Secondary | ICD-10-CM | POA: Diagnosis not present

## 2023-01-03 DIAGNOSIS — Z20828 Contact with and (suspected) exposure to other viral communicable diseases: Secondary | ICD-10-CM | POA: Diagnosis not present

## 2023-01-03 DIAGNOSIS — E663 Overweight: Secondary | ICD-10-CM | POA: Diagnosis not present

## 2023-01-03 DIAGNOSIS — Z6826 Body mass index (BMI) 26.0-26.9, adult: Secondary | ICD-10-CM | POA: Diagnosis not present

## 2023-01-03 DIAGNOSIS — J069 Acute upper respiratory infection, unspecified: Secondary | ICD-10-CM | POA: Diagnosis not present

## 2023-02-01 ENCOUNTER — Other Ambulatory Visit (HOSPITAL_COMMUNITY): Payer: Self-pay | Admitting: Adult Health

## 2023-02-01 DIAGNOSIS — N6002 Solitary cyst of left breast: Secondary | ICD-10-CM

## 2023-02-20 ENCOUNTER — Ambulatory Visit (HOSPITAL_COMMUNITY): Admission: RE | Admit: 2023-02-20 | Payer: BC Managed Care – PPO | Source: Ambulatory Visit

## 2023-03-08 DIAGNOSIS — Z6827 Body mass index (BMI) 27.0-27.9, adult: Secondary | ICD-10-CM | POA: Diagnosis not present

## 2023-03-08 DIAGNOSIS — E663 Overweight: Secondary | ICD-10-CM | POA: Diagnosis not present

## 2023-03-08 DIAGNOSIS — M5441 Lumbago with sciatica, right side: Secondary | ICD-10-CM | POA: Diagnosis not present

## 2023-04-10 ENCOUNTER — Ambulatory Visit (HOSPITAL_COMMUNITY): Payer: BC Managed Care – PPO

## 2023-04-10 ENCOUNTER — Ambulatory Visit (HOSPITAL_COMMUNITY): Payer: BC Managed Care – PPO | Attending: Adult Health

## 2023-05-14 DIAGNOSIS — Z0001 Encounter for general adult medical examination with abnormal findings: Secondary | ICD-10-CM | POA: Diagnosis not present

## 2023-05-14 DIAGNOSIS — E063 Autoimmune thyroiditis: Secondary | ICD-10-CM | POA: Diagnosis not present

## 2023-05-14 DIAGNOSIS — E663 Overweight: Secondary | ICD-10-CM | POA: Diagnosis not present

## 2023-05-14 DIAGNOSIS — K219 Gastro-esophageal reflux disease without esophagitis: Secondary | ICD-10-CM | POA: Diagnosis not present

## 2023-05-14 DIAGNOSIS — F419 Anxiety disorder, unspecified: Secondary | ICD-10-CM | POA: Diagnosis not present

## 2023-05-14 DIAGNOSIS — Z6827 Body mass index (BMI) 27.0-27.9, adult: Secondary | ICD-10-CM | POA: Diagnosis not present

## 2023-06-06 ENCOUNTER — Ambulatory Visit: Payer: BC Managed Care – PPO | Admitting: Adult Health

## 2023-07-01 ENCOUNTER — Other Ambulatory Visit: Payer: Self-pay | Admitting: Adult Health

## 2023-07-19 ENCOUNTER — Ambulatory Visit: Payer: BC Managed Care – PPO | Admitting: Adult Health

## 2023-07-19 ENCOUNTER — Encounter: Payer: Self-pay | Admitting: Adult Health

## 2023-07-19 VITALS — BP 123/69 | HR 80 | Ht 65.0 in | Wt 164.5 lb

## 2023-07-19 DIAGNOSIS — Z90710 Acquired absence of both cervix and uterus: Secondary | ICD-10-CM | POA: Diagnosis not present

## 2023-07-19 DIAGNOSIS — Z9223 Personal history of estrogen therapy: Secondary | ICD-10-CM | POA: Diagnosis not present

## 2023-07-19 DIAGNOSIS — Z1211 Encounter for screening for malignant neoplasm of colon: Secondary | ICD-10-CM | POA: Diagnosis not present

## 2023-07-19 DIAGNOSIS — E063 Autoimmune thyroiditis: Secondary | ICD-10-CM | POA: Diagnosis not present

## 2023-07-19 DIAGNOSIS — B001 Herpesviral vesicular dermatitis: Secondary | ICD-10-CM

## 2023-07-19 DIAGNOSIS — Z1331 Encounter for screening for depression: Secondary | ICD-10-CM

## 2023-07-19 DIAGNOSIS — Z01419 Encounter for gynecological examination (general) (routine) without abnormal findings: Secondary | ICD-10-CM

## 2023-07-19 LAB — HEMOCCULT GUIAC POC 1CARD (OFFICE): Fecal Occult Blood, POC: NEGATIVE

## 2023-07-19 MED ORDER — VALACYCLOVIR HCL 1 G PO TABS: 1000.0000 mg | ORAL_TABLET | Freq: Every day | ORAL | 12 refills | Status: AC

## 2023-07-19 MED ORDER — ESTRADIOL 0.1 MG/24HR TD PTTW: MEDICATED_PATCH | TRANSDERMAL | 4 refills | Status: AC

## 2023-07-19 MED ORDER — IBUPROFEN 800 MG PO TABS: 800.0000 mg | ORAL_TABLET | Freq: Three times a day (TID) | ORAL | 6 refills | Status: AC | PRN

## 2023-07-19 NOTE — Progress Notes (Signed)
 Patient ID: Mary Oconnell, female   DOB: 1965/11/17, 58 y.o.   MRN: [id number] History of Present Illness: Mary Oconnell is a 58 year old white female, divorced, sp hysterectomy in for a well woman gyn exam. She is still on Vivelle-dot patch and doing well.  PCP is Dr Sherwood Gambler   Current Medications, Allergies, Past Medical History, Past Surgical History, Family History and Social History were reviewed in [facility] electronic medical record.     Review of Systems: Patient denies any headaches, hearing loss, fatigue, blurred vision, shortness of breath, chest pain, abdominal pain, problems with bowel movements, urination, or intercourse. No joint pain or mood swings.  Has noticed brown spots on face, she does tan   Physical Exam:BP 123/69 (BP Location: Left Arm, Patient Position: Sitting, Cuff Size: Normal)   Pulse 80   Ht 5\' 5"  (1.651 m)   Wt 164 lb 8 oz (74.6 kg)   BMI 27.37 kg/m   General:  Well developed, well nourished, no acute distress Skin:  Warm and dry Neck:  Midline trachea, normal thyroid, good ROM, no lymphadenopathy Lungs; Clear to auscultation bilaterally Breast:  No dominant palpable mass, retraction, or nipple discharge Cardiovascular: Regular rate and rhythm Abdomen:  Soft, non tender, no hepatosplenomegaly Pelvic:  External genitalia is normal in appearance, no lesions.  The vagina is normal in appearance. Urethra has no lesions or masses. The cervix and uterus are absent.  No adnexal masses or tenderness noted.Bladder is non tender, no masses felt. Rectal: Good sphincter tone, no polyps, or hemorrhoids felt.  Hemoccult negative. Extremities/musculoskeletal:  No swelling or varicosities noted, no clubbing or cyanosis Psych:  No mood changes, alert and cooperative,seems happy AA is 3 Fall risk is low    07/19/2023    8:32 AM 06/05/2022    1:31 PM 04/13/2020    4:10 PM  Depression screen PHQ 2/9  Decreased Interest 0 0 0  Down, Depressed, Hopeless 0 0 0  PHQ - 2 Score  0 0 0  Altered sleeping 0 0 0  Tired, decreased energy 0 0 1  Change in appetite 0 0 0  Feeling bad or failure about yourself  0 0 0  Trouble concentrating 0 0 0  Moving slowly or fidgety/restless 0 0 0  Suicidal thoughts 0 0 0  PHQ-9 Score 0 0 1       07/19/2023    8:32 AM 06/05/2022    1:31 PM 04/13/2020    4:10 PM  GAD 7 : Generalized Anxiety Score  Nervous, Anxious, on Edge 0 0 0  Control/stop worrying 0 0 0  Worry too much - different things 0 0 0  Trouble relaxing 0 0 0  Restless 0 0 0  Easily annoyed or irritable 0 0 0  Afraid - awful might happen 0 0 0  Total GAD 7 Score 0 0 0      Upstream - 07/19/23 0843       Pregnancy Intention Screening   Does the patient want to become pregnant in the next year? N/A    Does the patient's partner want to become pregnant in the next year? N/A    Would the patient like to discuss contraceptive options today? N/A      Contraception Wrap Up   Current Method Female Sterilization   hyst   End Method Female Sterilization   hyst   Contraception Counseling Provided No            Examination chaperoned by Marylu Lund  Young LPN   Impression and plan: 1. Encounter for well woman exam with routine gynecological exam (Primary) Physical in 1 year Mammogram was 06/27/22 call for appt Colonoscopy per GI Labs with PCP Stay active  2. Encounter for screening fecal occult blood testing Hemoccult was negative   3. S/P hysterectomy  4. H/O estrogen therapy Happy with Vivelle-dot, will refill  Meds ordered this encounter  Medications   estradiol (VIVELLE-DOT) 0.1 MG/24HR patch    Sig: APPLY 1 PATCH TOPICALLY TO THE SKIN 2 TIMES WEEKLY    Dispense:  24 patch    Refill:  4    Supervising Provider:   Duane Lope H [[id number]]   valACYclovir (VALTREX) 1000 MG tablet    Sig: Take 1 tablet (1,000 mg total) by mouth daily.    Dispense:  30 tablet    Refill:  12    Supervising Provider:   Duane Lope H [[id number]]   ibuprofen (ADVIL) 800 MG  tablet    Sig: Take 1 tablet (800 mg total) by mouth every 8 (eight) hours as needed.    Dispense:  30 tablet    Refill:  6    Supervising Provider:   Duane Lope H [[id number]]     5. Cold sore Refill valtrex And refilled motrin 800 mg

## 2023-09-14 ENCOUNTER — Telehealth: Admitting: Family Medicine

## 2023-09-14 ENCOUNTER — Telehealth: Payer: Self-pay | Admitting: Adult Health

## 2023-09-14 DIAGNOSIS — B3731 Acute candidiasis of vulva and vagina: Secondary | ICD-10-CM | POA: Diagnosis not present

## 2023-09-14 MED ORDER — FLUCONAZOLE 150 MG PO TABS: 150.0000 mg | ORAL_TABLET | ORAL | 0 refills | Status: AC | PRN

## 2023-09-14 NOTE — Telephone Encounter (Signed)
Left message @ 11:10 am. JSY 

## 2023-09-14 NOTE — Telephone Encounter (Signed)
 Patient calling wanting to speak to Jenn's nurse states that she may have a uti wants to know if you will call her

## 2023-09-14 NOTE — Progress Notes (Signed)

## 2023-09-17 NOTE — Telephone Encounter (Signed)
 Left message @ 3:24 pm. JSY

## 2023-09-20 NOTE — Telephone Encounter (Signed)
 Multiple attempts to reach pt. Closing encounter. JSY

## 2023-09-26 ENCOUNTER — Ambulatory Visit: Admitting: Adult Health

## 2023-09-28 ENCOUNTER — Ambulatory Visit: Admitting: Adult Health

## 2023-10-16 DIAGNOSIS — R1084 Generalized abdominal pain: Secondary | ICD-10-CM | POA: Diagnosis not present

## 2023-10-16 DIAGNOSIS — Z6827 Body mass index (BMI) 27.0-27.9, adult: Secondary | ICD-10-CM | POA: Diagnosis not present

## 2023-10-16 DIAGNOSIS — R140 Abdominal distension (gaseous): Secondary | ICD-10-CM | POA: Diagnosis not present

## 2023-10-23 DIAGNOSIS — C563 Malignant neoplasm of bilateral ovaries: Secondary | ICD-10-CM | POA: Diagnosis not present

## 2023-10-23 DIAGNOSIS — R519 Headache, unspecified: Secondary | ICD-10-CM | POA: Diagnosis not present

## 2023-10-23 DIAGNOSIS — R1900 Intra-abdominal and pelvic swelling, mass and lump, unspecified site: Secondary | ICD-10-CM | POA: Diagnosis not present

## 2023-10-23 DIAGNOSIS — J929 Pleural plaque without asbestos: Secondary | ICD-10-CM | POA: Diagnosis not present

## 2023-10-23 DIAGNOSIS — R918 Other nonspecific abnormal finding of lung field: Secondary | ICD-10-CM | POA: Diagnosis not present

## 2023-10-23 DIAGNOSIS — D75839 Thrombocytosis, unspecified: Secondary | ICD-10-CM | POA: Diagnosis not present

## 2023-10-23 DIAGNOSIS — R1084 Generalized abdominal pain: Secondary | ICD-10-CM | POA: Diagnosis not present

## 2023-10-23 DIAGNOSIS — K219 Gastro-esophageal reflux disease without esophagitis: Secondary | ICD-10-CM | POA: Diagnosis not present

## 2023-10-23 DIAGNOSIS — R1012 Left upper quadrant pain: Secondary | ICD-10-CM | POA: Diagnosis not present

## 2023-10-23 DIAGNOSIS — C800 Disseminated malignant neoplasm, unspecified: Secondary | ICD-10-CM | POA: Diagnosis not present

## 2023-10-23 DIAGNOSIS — C7960 Secondary malignant neoplasm of unspecified ovary: Secondary | ICD-10-CM | POA: Diagnosis not present

## 2023-10-23 DIAGNOSIS — C786 Secondary malignant neoplasm of retroperitoneum and peritoneum: Secondary | ICD-10-CM | POA: Diagnosis not present

## 2023-10-23 DIAGNOSIS — Z800 Family history of malignant neoplasm of digestive organs: Secondary | ICD-10-CM | POA: Diagnosis not present

## 2023-10-23 DIAGNOSIS — Z90710 Acquired absence of both cervix and uterus: Secondary | ICD-10-CM | POA: Diagnosis not present

## 2023-10-23 DIAGNOSIS — N9489 Other specified conditions associated with female genital organs and menstrual cycle: Secondary | ICD-10-CM | POA: Diagnosis not present

## 2023-10-23 DIAGNOSIS — N838 Other noninflammatory disorders of ovary, fallopian tube and broad ligament: Secondary | ICD-10-CM | POA: Diagnosis not present

## 2023-10-23 DIAGNOSIS — F419 Anxiety disorder, unspecified: Secondary | ICD-10-CM | POA: Diagnosis not present

## 2023-10-23 DIAGNOSIS — Z5111 Encounter for antineoplastic chemotherapy: Secondary | ICD-10-CM | POA: Diagnosis not present

## 2023-10-23 DIAGNOSIS — E8809 Other disorders of plasma-protein metabolism, not elsewhere classified: Secondary | ICD-10-CM | POA: Diagnosis not present

## 2023-10-23 DIAGNOSIS — E440 Moderate protein-calorie malnutrition: Secondary | ICD-10-CM | POA: Diagnosis not present

## 2023-10-23 DIAGNOSIS — J90 Pleural effusion, not elsewhere classified: Secondary | ICD-10-CM | POA: Diagnosis not present

## 2023-10-23 DIAGNOSIS — E039 Hypothyroidism, unspecified: Secondary | ICD-10-CM | POA: Diagnosis not present

## 2023-10-23 DIAGNOSIS — R180 Malignant ascites: Secondary | ICD-10-CM | POA: Diagnosis not present

## 2023-10-23 DIAGNOSIS — R112 Nausea with vomiting, unspecified: Secondary | ICD-10-CM | POA: Diagnosis not present

## 2023-10-23 DIAGNOSIS — Z803 Family history of malignant neoplasm of breast: Secondary | ICD-10-CM | POA: Diagnosis not present

## 2023-10-23 DIAGNOSIS — R109 Unspecified abdominal pain: Secondary | ICD-10-CM | POA: Diagnosis not present

## 2023-10-23 DIAGNOSIS — Z91040 Latex allergy status: Secondary | ICD-10-CM | POA: Diagnosis not present

## 2023-10-23 DIAGNOSIS — R1909 Other intra-abdominal and pelvic swelling, mass and lump: Secondary | ICD-10-CM | POA: Diagnosis not present

## 2023-10-23 DIAGNOSIS — C569 Malignant neoplasm of unspecified ovary: Secondary | ICD-10-CM | POA: Diagnosis not present

## 2023-10-23 DIAGNOSIS — Z6827 Body mass index (BMI) 27.0-27.9, adult: Secondary | ICD-10-CM | POA: Diagnosis not present

## 2023-10-23 DIAGNOSIS — R188 Other ascites: Secondary | ICD-10-CM | POA: Diagnosis not present

## 2023-10-23 DIAGNOSIS — R10817 Generalized abdominal tenderness: Secondary | ICD-10-CM | POA: Diagnosis not present

## 2023-10-23 DIAGNOSIS — R590 Localized enlarged lymph nodes: Secondary | ICD-10-CM | POA: Diagnosis not present

## 2023-11-05 ENCOUNTER — Encounter: Payer: Self-pay | Admitting: Internal Medicine

## 2023-11-05 DIAGNOSIS — R300 Dysuria: Secondary | ICD-10-CM | POA: Diagnosis not present

## 2023-11-09 DIAGNOSIS — Z452 Encounter for adjustment and management of vascular access device: Secondary | ICD-10-CM | POA: Diagnosis not present

## 2023-11-09 DIAGNOSIS — C569 Malignant neoplasm of unspecified ovary: Secondary | ICD-10-CM | POA: Diagnosis not present

## 2023-11-09 DIAGNOSIS — C563 Malignant neoplasm of bilateral ovaries: Secondary | ICD-10-CM | POA: Diagnosis not present

## 2023-11-09 DIAGNOSIS — Z5111 Encounter for antineoplastic chemotherapy: Secondary | ICD-10-CM | POA: Diagnosis not present

## 2023-11-13 DIAGNOSIS — R180 Malignant ascites: Secondary | ICD-10-CM | POA: Diagnosis not present

## 2023-11-13 DIAGNOSIS — J90 Pleural effusion, not elsewhere classified: Secondary | ICD-10-CM | POA: Diagnosis not present

## 2023-11-13 DIAGNOSIS — Z79899 Other long term (current) drug therapy: Secondary | ICD-10-CM | POA: Diagnosis not present

## 2023-11-13 DIAGNOSIS — C786 Secondary malignant neoplasm of retroperitoneum and peritoneum: Secondary | ICD-10-CM | POA: Diagnosis not present

## 2023-11-13 DIAGNOSIS — C569 Malignant neoplasm of unspecified ovary: Secondary | ICD-10-CM | POA: Diagnosis not present

## 2023-11-13 DIAGNOSIS — R110 Nausea: Secondary | ICD-10-CM | POA: Diagnosis not present

## 2023-11-13 DIAGNOSIS — Z87891 Personal history of nicotine dependence: Secondary | ICD-10-CM | POA: Diagnosis not present

## 2023-11-13 DIAGNOSIS — F419 Anxiety disorder, unspecified: Secondary | ICD-10-CM | POA: Diagnosis not present

## 2023-11-13 DIAGNOSIS — K219 Gastro-esophageal reflux disease without esophagitis: Secondary | ICD-10-CM | POA: Diagnosis not present

## 2023-11-13 DIAGNOSIS — Z90710 Acquired absence of both cervix and uterus: Secondary | ICD-10-CM | POA: Diagnosis not present

## 2023-11-20 DIAGNOSIS — Z5112 Encounter for antineoplastic immunotherapy: Secondary | ICD-10-CM | POA: Diagnosis not present

## 2023-11-20 DIAGNOSIS — C569 Malignant neoplasm of unspecified ovary: Secondary | ICD-10-CM | POA: Diagnosis not present

## 2023-11-27 DIAGNOSIS — C569 Malignant neoplasm of unspecified ovary: Secondary | ICD-10-CM | POA: Diagnosis not present

## 2023-11-27 DIAGNOSIS — F419 Anxiety disorder, unspecified: Secondary | ICD-10-CM | POA: Diagnosis not present

## 2023-12-11 DIAGNOSIS — Z5111 Encounter for antineoplastic chemotherapy: Secondary | ICD-10-CM | POA: Diagnosis not present

## 2023-12-11 DIAGNOSIS — C569 Malignant neoplasm of unspecified ovary: Secondary | ICD-10-CM | POA: Diagnosis not present

## 2023-12-31 DIAGNOSIS — R918 Other nonspecific abnormal finding of lung field: Secondary | ICD-10-CM | POA: Diagnosis not present

## 2023-12-31 DIAGNOSIS — J90 Pleural effusion, not elsewhere classified: Secondary | ICD-10-CM | POA: Diagnosis not present

## 2023-12-31 DIAGNOSIS — C569 Malignant neoplasm of unspecified ovary: Secondary | ICD-10-CM | POA: Diagnosis not present

## 2024-01-01 DIAGNOSIS — R978 Other abnormal tumor markers: Secondary | ICD-10-CM | POA: Diagnosis not present

## 2024-01-01 DIAGNOSIS — Z79899 Other long term (current) drug therapy: Secondary | ICD-10-CM | POA: Diagnosis not present

## 2024-01-01 DIAGNOSIS — E039 Hypothyroidism, unspecified: Secondary | ICD-10-CM | POA: Diagnosis not present

## 2024-01-01 DIAGNOSIS — J45909 Unspecified asthma, uncomplicated: Secondary | ICD-10-CM | POA: Diagnosis not present

## 2024-01-01 DIAGNOSIS — J90 Pleural effusion, not elsewhere classified: Secondary | ICD-10-CM | POA: Diagnosis not present

## 2024-01-01 DIAGNOSIS — K219 Gastro-esophageal reflux disease without esophagitis: Secondary | ICD-10-CM | POA: Diagnosis not present

## 2024-01-01 DIAGNOSIS — C569 Malignant neoplasm of unspecified ovary: Secondary | ICD-10-CM | POA: Diagnosis not present

## 2024-01-01 DIAGNOSIS — Z5111 Encounter for antineoplastic chemotherapy: Secondary | ICD-10-CM | POA: Diagnosis not present

## 2024-01-01 DIAGNOSIS — Z79890 Hormone replacement therapy (postmenopausal): Secondary | ICD-10-CM | POA: Diagnosis not present

## 2024-01-01 DIAGNOSIS — F419 Anxiety disorder, unspecified: Secondary | ICD-10-CM | POA: Diagnosis not present

## 2024-01-01 DIAGNOSIS — Z87891 Personal history of nicotine dependence: Secondary | ICD-10-CM | POA: Diagnosis not present

## 2024-01-01 DIAGNOSIS — K5900 Constipation, unspecified: Secondary | ICD-10-CM | POA: Diagnosis not present

## 2024-01-25 ENCOUNTER — Other Ambulatory Visit (HOSPITAL_COMMUNITY): Payer: Self-pay

## 2024-02-05 DIAGNOSIS — G8918 Other acute postprocedural pain: Secondary | ICD-10-CM | POA: Diagnosis not present

## 2024-02-05 DIAGNOSIS — E860 Dehydration: Secondary | ICD-10-CM | POA: Diagnosis not present

## 2024-02-05 DIAGNOSIS — J90 Pleural effusion, not elsewhere classified: Secondary | ICD-10-CM | POA: Diagnosis not present

## 2024-02-05 DIAGNOSIS — Z8616 Personal history of COVID-19: Secondary | ICD-10-CM | POA: Diagnosis not present

## 2024-02-05 DIAGNOSIS — R112 Nausea with vomiting, unspecified: Secondary | ICD-10-CM | POA: Diagnosis not present

## 2024-02-05 DIAGNOSIS — C786 Secondary malignant neoplasm of retroperitoneum and peritoneum: Secondary | ICD-10-CM | POA: Diagnosis not present

## 2024-02-05 DIAGNOSIS — Z9049 Acquired absence of other specified parts of digestive tract: Secondary | ICD-10-CM | POA: Diagnosis not present

## 2024-02-05 DIAGNOSIS — C785 Secondary malignant neoplasm of large intestine and rectum: Secondary | ICD-10-CM | POA: Diagnosis not present

## 2024-02-05 DIAGNOSIS — R188 Other ascites: Secondary | ICD-10-CM | POA: Diagnosis not present

## 2024-02-05 DIAGNOSIS — N179 Acute kidney failure, unspecified: Secondary | ICD-10-CM | POA: Diagnosis not present

## 2024-02-05 DIAGNOSIS — K589 Irritable bowel syndrome without diarrhea: Secondary | ICD-10-CM | POA: Diagnosis not present

## 2024-02-05 DIAGNOSIS — C775 Secondary and unspecified malignant neoplasm of intrapelvic lymph nodes: Secondary | ICD-10-CM | POA: Diagnosis not present

## 2024-02-05 DIAGNOSIS — C5702 Malignant neoplasm of left fallopian tube: Secondary | ICD-10-CM | POA: Diagnosis not present

## 2024-02-05 DIAGNOSIS — J45909 Unspecified asthma, uncomplicated: Secondary | ICD-10-CM | POA: Diagnosis not present

## 2024-02-05 DIAGNOSIS — K567 Ileus, unspecified: Secondary | ICD-10-CM | POA: Diagnosis not present

## 2024-02-05 DIAGNOSIS — C563 Malignant neoplasm of bilateral ovaries: Secondary | ICD-10-CM | POA: Diagnosis not present

## 2024-02-05 DIAGNOSIS — C188 Malignant neoplasm of overlapping sites of colon: Secondary | ICD-10-CM | POA: Diagnosis not present

## 2024-02-05 DIAGNOSIS — Z9889 Other specified postprocedural states: Secondary | ICD-10-CM | POA: Diagnosis not present

## 2024-02-05 DIAGNOSIS — R739 Hyperglycemia, unspecified: Secondary | ICD-10-CM | POA: Diagnosis not present

## 2024-02-05 DIAGNOSIS — Z79899 Other long term (current) drug therapy: Secondary | ICD-10-CM | POA: Diagnosis not present

## 2024-02-05 DIAGNOSIS — Z452 Encounter for adjustment and management of vascular access device: Secondary | ICD-10-CM | POA: Diagnosis not present

## 2024-02-05 DIAGNOSIS — K6389 Other specified diseases of intestine: Secondary | ICD-10-CM | POA: Diagnosis not present

## 2024-02-05 DIAGNOSIS — M797 Fibromyalgia: Secondary | ICD-10-CM | POA: Diagnosis not present

## 2024-02-05 DIAGNOSIS — K219 Gastro-esophageal reflux disease without esophagitis: Secondary | ICD-10-CM | POA: Diagnosis not present

## 2024-02-05 DIAGNOSIS — C7982 Secondary malignant neoplasm of genital organs: Secondary | ICD-10-CM | POA: Diagnosis not present

## 2024-02-05 DIAGNOSIS — K9189 Other postprocedural complications and disorders of digestive system: Secondary | ICD-10-CM | POA: Diagnosis not present

## 2024-02-05 DIAGNOSIS — C5701 Malignant neoplasm of right fallopian tube: Secondary | ICD-10-CM | POA: Diagnosis not present

## 2024-02-05 DIAGNOSIS — C784 Secondary malignant neoplasm of small intestine: Secondary | ICD-10-CM | POA: Diagnosis not present

## 2024-02-05 DIAGNOSIS — K56600 Partial intestinal obstruction, unspecified as to cause: Secondary | ICD-10-CM | POA: Diagnosis not present

## 2024-02-05 DIAGNOSIS — Z4682 Encounter for fitting and adjustment of non-vascular catheter: Secondary | ICD-10-CM | POA: Diagnosis not present

## 2024-02-05 DIAGNOSIS — R34 Anuria and oliguria: Secondary | ICD-10-CM | POA: Diagnosis not present

## 2024-02-05 DIAGNOSIS — R109 Unspecified abdominal pain: Secondary | ICD-10-CM | POA: Diagnosis not present

## 2024-02-05 DIAGNOSIS — C787 Secondary malignant neoplasm of liver and intrahepatic bile duct: Secondary | ICD-10-CM | POA: Diagnosis not present

## 2024-02-05 DIAGNOSIS — E039 Hypothyroidism, unspecified: Secondary | ICD-10-CM | POA: Diagnosis not present

## 2024-02-05 DIAGNOSIS — C7989 Secondary malignant neoplasm of other specified sites: Secondary | ICD-10-CM | POA: Diagnosis not present

## 2024-02-05 DIAGNOSIS — Z6826 Body mass index (BMI) 26.0-26.9, adult: Secondary | ICD-10-CM | POA: Diagnosis not present

## 2024-02-05 DIAGNOSIS — C569 Malignant neoplasm of unspecified ovary: Secondary | ICD-10-CM | POA: Diagnosis not present

## 2024-02-05 DIAGNOSIS — J984 Other disorders of lung: Secondary | ICD-10-CM | POA: Diagnosis not present

## 2024-02-05 DIAGNOSIS — C229 Malignant neoplasm of liver, not specified as primary or secondary: Secondary | ICD-10-CM | POA: Diagnosis not present

## 2024-02-05 DIAGNOSIS — C181 Malignant neoplasm of appendix: Secondary | ICD-10-CM | POA: Diagnosis not present

## 2024-02-05 DIAGNOSIS — C763 Malignant neoplasm of pelvis: Secondary | ICD-10-CM | POA: Diagnosis not present

## 2024-02-05 DIAGNOSIS — C7889 Secondary malignant neoplasm of other digestive organs: Secondary | ICD-10-CM | POA: Diagnosis not present

## 2024-02-05 DIAGNOSIS — Z87891 Personal history of nicotine dependence: Secondary | ICD-10-CM | POA: Diagnosis not present

## 2024-02-05 DIAGNOSIS — E43 Unspecified severe protein-calorie malnutrition: Secondary | ICD-10-CM | POA: Diagnosis not present

## 2024-02-05 DIAGNOSIS — R918 Other nonspecific abnormal finding of lung field: Secondary | ICD-10-CM | POA: Diagnosis not present

## 2024-02-06 DIAGNOSIS — G8918 Other acute postprocedural pain: Secondary | ICD-10-CM | POA: Diagnosis not present

## 2024-02-06 DIAGNOSIS — R109 Unspecified abdominal pain: Secondary | ICD-10-CM | POA: Diagnosis not present

## 2024-02-07 DIAGNOSIS — R109 Unspecified abdominal pain: Secondary | ICD-10-CM | POA: Diagnosis not present

## 2024-02-07 DIAGNOSIS — G8918 Other acute postprocedural pain: Secondary | ICD-10-CM | POA: Diagnosis not present

## 2024-02-08 DIAGNOSIS — R109 Unspecified abdominal pain: Secondary | ICD-10-CM | POA: Diagnosis not present

## 2024-02-08 DIAGNOSIS — G8918 Other acute postprocedural pain: Secondary | ICD-10-CM | POA: Diagnosis not present

## 2024-02-11 DIAGNOSIS — G8918 Other acute postprocedural pain: Secondary | ICD-10-CM | POA: Diagnosis not present

## 2024-02-11 DIAGNOSIS — R109 Unspecified abdominal pain: Secondary | ICD-10-CM | POA: Diagnosis not present

## 2024-02-20 DIAGNOSIS — Z932 Ileostomy status: Secondary | ICD-10-CM | POA: Diagnosis not present

## 2024-02-22 DIAGNOSIS — Z7901 Long term (current) use of anticoagulants: Secondary | ICD-10-CM | POA: Diagnosis not present

## 2024-02-22 DIAGNOSIS — Z90411 Acquired partial absence of pancreas: Secondary | ICD-10-CM | POA: Diagnosis not present

## 2024-02-22 DIAGNOSIS — K589 Irritable bowel syndrome without diarrhea: Secondary | ICD-10-CM | POA: Diagnosis not present

## 2024-02-22 DIAGNOSIS — K219 Gastro-esophageal reflux disease without esophagitis: Secondary | ICD-10-CM | POA: Diagnosis not present

## 2024-02-22 DIAGNOSIS — M797 Fibromyalgia: Secondary | ICD-10-CM | POA: Diagnosis not present

## 2024-02-22 DIAGNOSIS — Z90722 Acquired absence of ovaries, bilateral: Secondary | ICD-10-CM | POA: Diagnosis not present

## 2024-02-22 DIAGNOSIS — F419 Anxiety disorder, unspecified: Secondary | ICD-10-CM | POA: Diagnosis not present

## 2024-02-22 DIAGNOSIS — Z9089 Acquired absence of other organs: Secondary | ICD-10-CM | POA: Diagnosis not present

## 2024-02-22 DIAGNOSIS — J45909 Unspecified asthma, uncomplicated: Secondary | ICD-10-CM | POA: Diagnosis not present

## 2024-02-22 DIAGNOSIS — K9189 Other postprocedural complications and disorders of digestive system: Secondary | ICD-10-CM | POA: Diagnosis not present

## 2024-02-22 DIAGNOSIS — Z8616 Personal history of COVID-19: Secondary | ICD-10-CM | POA: Diagnosis not present

## 2024-02-22 DIAGNOSIS — C563 Malignant neoplasm of bilateral ovaries: Secondary | ICD-10-CM | POA: Diagnosis not present

## 2024-02-22 DIAGNOSIS — E039 Hypothyroidism, unspecified: Secondary | ICD-10-CM | POA: Diagnosis not present

## 2024-02-22 DIAGNOSIS — Z87891 Personal history of nicotine dependence: Secondary | ICD-10-CM | POA: Diagnosis not present

## 2024-02-22 DIAGNOSIS — Z7952 Long term (current) use of systemic steroids: Secondary | ICD-10-CM | POA: Diagnosis not present

## 2024-02-22 DIAGNOSIS — Z9081 Acquired absence of spleen: Secondary | ICD-10-CM | POA: Diagnosis not present

## 2024-02-22 DIAGNOSIS — Z9181 History of falling: Secondary | ICD-10-CM | POA: Diagnosis not present

## 2024-02-25 DIAGNOSIS — Z87891 Personal history of nicotine dependence: Secondary | ICD-10-CM | POA: Diagnosis not present

## 2024-02-25 DIAGNOSIS — Z7952 Long term (current) use of systemic steroids: Secondary | ICD-10-CM | POA: Diagnosis not present

## 2024-02-25 DIAGNOSIS — F419 Anxiety disorder, unspecified: Secondary | ICD-10-CM | POA: Diagnosis not present

## 2024-02-25 DIAGNOSIS — K9189 Other postprocedural complications and disorders of digestive system: Secondary | ICD-10-CM | POA: Diagnosis not present

## 2024-02-25 DIAGNOSIS — K589 Irritable bowel syndrome without diarrhea: Secondary | ICD-10-CM | POA: Diagnosis not present

## 2024-02-25 DIAGNOSIS — M797 Fibromyalgia: Secondary | ICD-10-CM | POA: Diagnosis not present

## 2024-02-25 DIAGNOSIS — C563 Malignant neoplasm of bilateral ovaries: Secondary | ICD-10-CM | POA: Diagnosis not present

## 2024-02-25 DIAGNOSIS — Z9089 Acquired absence of other organs: Secondary | ICD-10-CM | POA: Diagnosis not present

## 2024-02-25 DIAGNOSIS — Z9081 Acquired absence of spleen: Secondary | ICD-10-CM | POA: Diagnosis not present

## 2024-02-25 DIAGNOSIS — K219 Gastro-esophageal reflux disease without esophagitis: Secondary | ICD-10-CM | POA: Diagnosis not present

## 2024-02-25 DIAGNOSIS — J45909 Unspecified asthma, uncomplicated: Secondary | ICD-10-CM | POA: Diagnosis not present

## 2024-02-25 DIAGNOSIS — Z9181 History of falling: Secondary | ICD-10-CM | POA: Diagnosis not present

## 2024-02-25 DIAGNOSIS — Z7901 Long term (current) use of anticoagulants: Secondary | ICD-10-CM | POA: Diagnosis not present

## 2024-02-25 DIAGNOSIS — Z90722 Acquired absence of ovaries, bilateral: Secondary | ICD-10-CM | POA: Diagnosis not present

## 2024-02-25 DIAGNOSIS — Z90411 Acquired partial absence of pancreas: Secondary | ICD-10-CM | POA: Diagnosis not present

## 2024-02-25 DIAGNOSIS — E039 Hypothyroidism, unspecified: Secondary | ICD-10-CM | POA: Diagnosis not present

## 2024-02-25 DIAGNOSIS — Z8616 Personal history of COVID-19: Secondary | ICD-10-CM | POA: Diagnosis not present

## 2024-02-27 DIAGNOSIS — K9189 Other postprocedural complications and disorders of digestive system: Secondary | ICD-10-CM | POA: Diagnosis not present

## 2024-02-27 DIAGNOSIS — Z87891 Personal history of nicotine dependence: Secondary | ICD-10-CM | POA: Diagnosis not present

## 2024-02-27 DIAGNOSIS — E039 Hypothyroidism, unspecified: Secondary | ICD-10-CM | POA: Diagnosis not present

## 2024-02-27 DIAGNOSIS — K589 Irritable bowel syndrome without diarrhea: Secondary | ICD-10-CM | POA: Diagnosis not present

## 2024-02-27 DIAGNOSIS — Z9089 Acquired absence of other organs: Secondary | ICD-10-CM | POA: Diagnosis not present

## 2024-02-27 DIAGNOSIS — Z90411 Acquired partial absence of pancreas: Secondary | ICD-10-CM | POA: Diagnosis not present

## 2024-02-27 DIAGNOSIS — F419 Anxiety disorder, unspecified: Secondary | ICD-10-CM | POA: Diagnosis not present

## 2024-02-27 DIAGNOSIS — M797 Fibromyalgia: Secondary | ICD-10-CM | POA: Diagnosis not present

## 2024-02-27 DIAGNOSIS — Z7901 Long term (current) use of anticoagulants: Secondary | ICD-10-CM | POA: Diagnosis not present

## 2024-02-27 DIAGNOSIS — J45909 Unspecified asthma, uncomplicated: Secondary | ICD-10-CM | POA: Diagnosis not present

## 2024-02-27 DIAGNOSIS — Z7952 Long term (current) use of systemic steroids: Secondary | ICD-10-CM | POA: Diagnosis not present

## 2024-02-27 DIAGNOSIS — C563 Malignant neoplasm of bilateral ovaries: Secondary | ICD-10-CM | POA: Diagnosis not present

## 2024-02-27 DIAGNOSIS — Z8616 Personal history of COVID-19: Secondary | ICD-10-CM | POA: Diagnosis not present

## 2024-02-27 DIAGNOSIS — K219 Gastro-esophageal reflux disease without esophagitis: Secondary | ICD-10-CM | POA: Diagnosis not present

## 2024-02-27 DIAGNOSIS — Z9181 History of falling: Secondary | ICD-10-CM | POA: Diagnosis not present

## 2024-02-27 DIAGNOSIS — Z90722 Acquired absence of ovaries, bilateral: Secondary | ICD-10-CM | POA: Diagnosis not present

## 2024-02-27 DIAGNOSIS — Z9081 Acquired absence of spleen: Secondary | ICD-10-CM | POA: Diagnosis not present

## 2024-03-04 DIAGNOSIS — C5702 Malignant neoplasm of left fallopian tube: Secondary | ICD-10-CM | POA: Diagnosis not present

## 2024-03-04 DIAGNOSIS — Z7901 Long term (current) use of anticoagulants: Secondary | ICD-10-CM | POA: Diagnosis not present

## 2024-03-04 DIAGNOSIS — Z803 Family history of malignant neoplasm of breast: Secondary | ICD-10-CM | POA: Diagnosis not present

## 2024-03-04 DIAGNOSIS — Z79899 Other long term (current) drug therapy: Secondary | ICD-10-CM | POA: Diagnosis not present

## 2024-03-04 DIAGNOSIS — Z90722 Acquired absence of ovaries, bilateral: Secondary | ICD-10-CM | POA: Diagnosis not present

## 2024-03-04 DIAGNOSIS — Z91040 Latex allergy status: Secondary | ICD-10-CM | POA: Diagnosis not present

## 2024-03-04 DIAGNOSIS — E039 Hypothyroidism, unspecified: Secondary | ICD-10-CM | POA: Diagnosis not present

## 2024-03-04 DIAGNOSIS — C5701 Malignant neoplasm of right fallopian tube: Secondary | ICD-10-CM | POA: Diagnosis not present

## 2024-03-04 DIAGNOSIS — Z932 Ileostomy status: Secondary | ICD-10-CM | POA: Diagnosis not present

## 2024-03-04 DIAGNOSIS — Z9081 Acquired absence of spleen: Secondary | ICD-10-CM | POA: Diagnosis not present

## 2024-03-04 DIAGNOSIS — K219 Gastro-esophageal reflux disease without esophagitis: Secondary | ICD-10-CM | POA: Diagnosis not present

## 2024-03-04 DIAGNOSIS — C569 Malignant neoplasm of unspecified ovary: Secondary | ICD-10-CM | POA: Diagnosis not present

## 2024-03-04 DIAGNOSIS — Z90410 Acquired total absence of pancreas: Secondary | ICD-10-CM | POA: Diagnosis not present

## 2024-03-04 DIAGNOSIS — F419 Anxiety disorder, unspecified: Secondary | ICD-10-CM | POA: Diagnosis not present

## 2024-03-04 DIAGNOSIS — L905 Scar conditions and fibrosis of skin: Secondary | ICD-10-CM | POA: Diagnosis not present

## 2024-03-04 DIAGNOSIS — C563 Malignant neoplasm of bilateral ovaries: Secondary | ICD-10-CM | POA: Diagnosis not present

## 2024-03-04 DIAGNOSIS — Z7189 Other specified counseling: Secondary | ICD-10-CM | POA: Diagnosis not present

## 2024-03-04 DIAGNOSIS — K589 Irritable bowel syndrome without diarrhea: Secondary | ICD-10-CM | POA: Diagnosis not present

## 2024-03-04 DIAGNOSIS — Z87891 Personal history of nicotine dependence: Secondary | ICD-10-CM | POA: Diagnosis not present

## 2024-03-04 DIAGNOSIS — Z8616 Personal history of COVID-19: Secondary | ICD-10-CM | POA: Diagnosis not present

## 2024-03-04 DIAGNOSIS — Z9049 Acquired absence of other specified parts of digestive tract: Secondary | ICD-10-CM | POA: Diagnosis not present

## 2024-03-04 DIAGNOSIS — J45909 Unspecified asthma, uncomplicated: Secondary | ICD-10-CM | POA: Diagnosis not present

## 2024-03-04 DIAGNOSIS — M797 Fibromyalgia: Secondary | ICD-10-CM | POA: Diagnosis not present

## 2024-03-04 DIAGNOSIS — Z800 Family history of malignant neoplasm of digestive organs: Secondary | ICD-10-CM | POA: Diagnosis not present

## 2024-03-04 DIAGNOSIS — Z90710 Acquired absence of both cervix and uterus: Secondary | ICD-10-CM | POA: Diagnosis not present

## 2024-03-07 DIAGNOSIS — C569 Malignant neoplasm of unspecified ovary: Secondary | ICD-10-CM | POA: Diagnosis not present

## 2024-03-11 DIAGNOSIS — C569 Malignant neoplasm of unspecified ovary: Secondary | ICD-10-CM | POA: Diagnosis not present

## 2024-03-11 DIAGNOSIS — Z5111 Encounter for antineoplastic chemotherapy: Secondary | ICD-10-CM | POA: Diagnosis not present

## 2024-03-21 DIAGNOSIS — Z90722 Acquired absence of ovaries, bilateral: Secondary | ICD-10-CM | POA: Diagnosis not present

## 2024-03-21 DIAGNOSIS — F419 Anxiety disorder, unspecified: Secondary | ICD-10-CM | POA: Diagnosis not present

## 2024-03-21 DIAGNOSIS — Z90411 Acquired partial absence of pancreas: Secondary | ICD-10-CM | POA: Diagnosis not present

## 2024-03-21 DIAGNOSIS — K219 Gastro-esophageal reflux disease without esophagitis: Secondary | ICD-10-CM | POA: Diagnosis not present

## 2024-03-21 DIAGNOSIS — Z7901 Long term (current) use of anticoagulants: Secondary | ICD-10-CM | POA: Diagnosis not present

## 2024-03-21 DIAGNOSIS — C563 Malignant neoplasm of bilateral ovaries: Secondary | ICD-10-CM | POA: Diagnosis not present

## 2024-03-21 DIAGNOSIS — Z9181 History of falling: Secondary | ICD-10-CM | POA: Diagnosis not present

## 2024-03-21 DIAGNOSIS — Z87891 Personal history of nicotine dependence: Secondary | ICD-10-CM | POA: Diagnosis not present

## 2024-03-21 DIAGNOSIS — Z9081 Acquired absence of spleen: Secondary | ICD-10-CM | POA: Diagnosis not present

## 2024-03-21 DIAGNOSIS — Z7952 Long term (current) use of systemic steroids: Secondary | ICD-10-CM | POA: Diagnosis not present

## 2024-03-21 DIAGNOSIS — J45909 Unspecified asthma, uncomplicated: Secondary | ICD-10-CM | POA: Diagnosis not present

## 2024-03-21 DIAGNOSIS — Z9089 Acquired absence of other organs: Secondary | ICD-10-CM | POA: Diagnosis not present

## 2024-03-21 DIAGNOSIS — E039 Hypothyroidism, unspecified: Secondary | ICD-10-CM | POA: Diagnosis not present

## 2024-03-21 DIAGNOSIS — K9189 Other postprocedural complications and disorders of digestive system: Secondary | ICD-10-CM | POA: Diagnosis not present

## 2024-03-21 DIAGNOSIS — M797 Fibromyalgia: Secondary | ICD-10-CM | POA: Diagnosis not present

## 2024-03-21 DIAGNOSIS — Z8616 Personal history of COVID-19: Secondary | ICD-10-CM | POA: Diagnosis not present

## 2024-03-21 DIAGNOSIS — K589 Irritable bowel syndrome without diarrhea: Secondary | ICD-10-CM | POA: Diagnosis not present

## 2024-03-26 DIAGNOSIS — Z90411 Acquired partial absence of pancreas: Secondary | ICD-10-CM | POA: Diagnosis not present

## 2024-03-26 DIAGNOSIS — Z9081 Acquired absence of spleen: Secondary | ICD-10-CM | POA: Diagnosis not present

## 2024-03-26 DIAGNOSIS — K589 Irritable bowel syndrome without diarrhea: Secondary | ICD-10-CM | POA: Diagnosis not present

## 2024-03-26 DIAGNOSIS — Z7952 Long term (current) use of systemic steroids: Secondary | ICD-10-CM | POA: Diagnosis not present

## 2024-03-26 DIAGNOSIS — J45909 Unspecified asthma, uncomplicated: Secondary | ICD-10-CM | POA: Diagnosis not present

## 2024-03-26 DIAGNOSIS — Z7901 Long term (current) use of anticoagulants: Secondary | ICD-10-CM | POA: Diagnosis not present

## 2024-03-26 DIAGNOSIS — M797 Fibromyalgia: Secondary | ICD-10-CM | POA: Diagnosis not present

## 2024-03-26 DIAGNOSIS — K219 Gastro-esophageal reflux disease without esophagitis: Secondary | ICD-10-CM | POA: Diagnosis not present

## 2024-03-26 DIAGNOSIS — Z9089 Acquired absence of other organs: Secondary | ICD-10-CM | POA: Diagnosis not present

## 2024-03-26 DIAGNOSIS — C563 Malignant neoplasm of bilateral ovaries: Secondary | ICD-10-CM | POA: Diagnosis not present

## 2024-03-26 DIAGNOSIS — Z87891 Personal history of nicotine dependence: Secondary | ICD-10-CM | POA: Diagnosis not present

## 2024-03-26 DIAGNOSIS — F419 Anxiety disorder, unspecified: Secondary | ICD-10-CM | POA: Diagnosis not present

## 2024-03-26 DIAGNOSIS — Z90722 Acquired absence of ovaries, bilateral: Secondary | ICD-10-CM | POA: Diagnosis not present

## 2024-03-26 DIAGNOSIS — E039 Hypothyroidism, unspecified: Secondary | ICD-10-CM | POA: Diagnosis not present

## 2024-03-26 DIAGNOSIS — Z8616 Personal history of COVID-19: Secondary | ICD-10-CM | POA: Diagnosis not present

## 2024-03-26 DIAGNOSIS — K9189 Other postprocedural complications and disorders of digestive system: Secondary | ICD-10-CM | POA: Diagnosis not present

## 2024-03-26 DIAGNOSIS — Z9181 History of falling: Secondary | ICD-10-CM | POA: Diagnosis not present

## 2024-04-01 DIAGNOSIS — Z5112 Encounter for antineoplastic immunotherapy: Secondary | ICD-10-CM | POA: Diagnosis not present

## 2024-04-01 DIAGNOSIS — Z5111 Encounter for antineoplastic chemotherapy: Secondary | ICD-10-CM | POA: Diagnosis not present

## 2024-04-01 DIAGNOSIS — Z79899 Other long term (current) drug therapy: Secondary | ICD-10-CM | POA: Diagnosis not present

## 2024-04-01 DIAGNOSIS — M898X9 Other specified disorders of bone, unspecified site: Secondary | ICD-10-CM | POA: Diagnosis not present

## 2024-04-01 DIAGNOSIS — G629 Polyneuropathy, unspecified: Secondary | ICD-10-CM | POA: Diagnosis not present

## 2024-04-01 DIAGNOSIS — Z87891 Personal history of nicotine dependence: Secondary | ICD-10-CM | POA: Diagnosis not present

## 2024-04-01 DIAGNOSIS — Z90710 Acquired absence of both cervix and uterus: Secondary | ICD-10-CM | POA: Diagnosis not present

## 2024-04-01 DIAGNOSIS — K219 Gastro-esophageal reflux disease without esophagitis: Secondary | ICD-10-CM | POA: Diagnosis not present

## 2024-04-01 DIAGNOSIS — C569 Malignant neoplasm of unspecified ovary: Secondary | ICD-10-CM | POA: Diagnosis not present

## 2024-04-02 DIAGNOSIS — J45998 Other asthma: Secondary | ICD-10-CM | POA: Diagnosis not present

## 2024-04-02 DIAGNOSIS — C569 Malignant neoplasm of unspecified ovary: Secondary | ICD-10-CM | POA: Diagnosis not present

## 2024-04-02 DIAGNOSIS — F411 Generalized anxiety disorder: Secondary | ICD-10-CM | POA: Diagnosis not present

## 2024-04-22 NOTE — Nursing Note (Signed)
 4-hour prep ordered and given today prior to CT contrast. Patient monitored during and post scan and brought back to ED. Patient reports no adverse reactions after contrast.

## 2024-04-22 NOTE — ED Provider Notes (Signed)
 ED Progress Note  Received sign out from previous provider.  Patient Summary: Mary Oconnell is a 58 y.o. 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  Patient noted to have swelling in her right leg.  Concern for pulmonary embolism.  CTA ordered and pending at time of signout.  Negative troponin.  Patient is stable on room air and heart rate has improved after fluids in the emergency department. Action List:  Pending CTA  Updates ED Course as of 04/22/24 1857  Tue Apr 22, 2024  1842 CTA Chest W Contrast No evidence of pulmonary thromboembolism. Interval increase in bilateral pleural effusions, now moderate in size. Grossly unchanged subcentimeter pulmonary nodules and lymph nodes as above.Outpatient low-dose CT surveillance is advised to 1 year.  1857 Patient was reevaluated at bedside.  Patient is resting comfortably on room air in no acute distress.  Discussed with patient that there was no PE on her CTA.  Patient is agreeable plan for discharge with strict return precautions and close follow-up with her oncology team.  Patient to be discharged in stable condition.

## 2024-04-22 NOTE — Progress Notes (Signed)
 Gynecologic Oncology Return Clinic Visit  April 22, 2024 10:12 AM  Reason for Visit: pre-infusion visit  Treatment History: Hematology/Oncology History  Primary high grade serous adenocarcinoma of ovary    (CMS-HCC)  10/23/2023 Biopsy    Diagnostic paracentesis - adenocarcinoma of gyn origin (most consistent with HGS carcinoma), PAX-8, WT-1, p16 positive; ER weekly positive, p53 mutated    10/23/2023 Tumor Markers   CA-125: 1,054    10/26/2023 Initial Diagnosis   Primary high grade serous adenocarcinoma of ovary    (CMS-HCC)   10/26/2023 -  Chemotherapy   11/20/23: C2 carboplatin /taxol /bevacizumab  12/11/23: C3 carb/taxol  (bev held)  12/31/23: C4 carb/taxol  (bev held)   02/05/2024 Surgery   Diagnostic laparoscopy, exploratory laparotomy, bilateral salpingo-oophorectomy, omentectomy, distal pancreatectomy, splenectomy, resection of segment 7 liver nodule, bilateral diaphragm ablation, pelvic peritoneal biopsies, resection of IVC nodule, ileocecectomy with side to side re-anastomosis and proximal ileostomy and rectosigmoid resection with end to end re-anastomosis    02/13/2024 Tumor Board   FIGO Stage: IV high grade serous adenocarcinoma of the ovary s/p 4 cycles of neoadjuvant carboplatin /taxol /bev (bev held C3 & 4) and RI interval debulking surgery (HER 2 negative) Plan:  - Recommend additional 3 cycles of carboplatin  + paclitaxel   - Restart bevacizumab  when appropriate from postoperative standpoint with bevacizumab  maintenance given residual disease. Could consider addition of maintenance PARP inhibitor if eligible based on HRD status given PAOLA 1 tiral.  - Recommend Germline and somatic tumor testing  - Clinical Trial: NRG HB963 (pending HRD status), GOG 3112   Ray-Coquard I, Bearl LABOR, Pignata S, Cropet C, Gonzlez-Martn A, Marth C, Nagao S, Vergote I, Colombo N, Menp J, Selle F, Sehouli J, Lorusso D, Guerra Alia EM, Bogner G, Yoshida H, Lefeuvre-Plesse C, Hardwick, Mosconi AM,  Lortholary A, Burges A, Medioni J, El-Balat A, Rodrigues M, Park-Simon TW, Dubot C, Denschlag D, You B, Pujade-Lauraine E, Harter P; PAOLA-1/ENGOT-ov25 investigators. Olaparib plus bevacizumab  first-line maintenance in ovarian cancer: final overall survival results from the PAOLA-1/ENGOT-ov25 trial. Jenkins Connors. 2023 Aug;34(8):681-692. doi: 10.1016/j.annonc.2023.05.005. Epub 2023 May 19. PMID: [id number].      Interval History: Feeling short of breath with any activity and at rest for 3 days. Can't walk from one side of the bed to the other without resting. Had some right calf pain last night, none this morning. Denies any leg swelling. Appetite stable. Ostomy output has been more watery for last few days. Denies urinary symptoms.  Past Medical/Surgical History: Past Medical History[1]  Past Surgical History[2]  Family History[3]  Social History[4]  Current Medications: Current Medications[5]  Review of Symptoms: Complete 10-system review is negative except as above in Interval History.  Physical Exam: BP 118/73   Pulse 127   Temp 37.1 C (98.8 F) (Temporal)   Resp 16   Ht 160 cm (5' 2.99)   Wt 61.8 kg (136 lb 4.8 oz)   SpO2 100%   BMI 24.15 kg/m  General: Alert, oriented, no acute distress. HEENT: Posterior oropharynx clear, sclera anicteric. Chest: Decreased breath sound and right lung base and left upper lung field. Otherwise, clear to auscultation bilaterally.  NO wheezes. Cardiovascular: Tachycardic (130s on auscultation), regular rhythm, no murmurs. Abdomen: soft, nontender.  Normoactive bowel sounds.  No masses or hepatosplenomegaly appreciated.   Extremities: Grossly normal range of motion.  Warm, well perfused.  No edema or calf tenderness on palpation. Skin: No rashes or lesions noted.  Laboratory & Radiologic Studies: None new  Assessment & Plan: Mary Oconnell is a 58 y.o. woman  with primary high grade serous adenocarcinoma of the fallopian tubes who  received 4C of NACT (carbo/taxol /bev, bev held for C3-4) now s/p diagnostic laparoscopy, exploratory laparotomy with radical tumor debulking including BSO, omentectomy, distal pancreatectomy, splenectomy, hepatectomy, bilateral diaphragm ablation, pelvic peritoneal biopsies, ileocecectomy and proximal ileostomy (9/23) who presents for post-op follow-up, pre-chemotherapy visit. 9/23: R1 resection (small implants on the aorta and IVC). CA125: 1054. CA19-9 and CEA were not elevated.    Somatic: MSS, HRD neg, TMB low, mutations in NF2, p53 and CSF3R. PDL1 TPS 5%. Germline negative.  Presenting for C7 carbo/taxol , plan to hold avastin again with C7 in anticipation of upcoming ostomy reversal on 1/13.  Interval CT to late December (with IV and PO contrast). Also needs rectal contrast. Plan for tentative pre-op on 1/6 and ileostomy reversal on 1/13.   Will ask pathology to add HER2 IHC. Will discuss with trials team for GOG 3112 if patient eligible given treatment break required for ileostomy reversal.    Patient today with symptoms and tachycardia concerning for pulmonary embolism. Rapid called to expedite patient getting CTA. Alerted inpatient team of plan for heparin gtt if PE confirmed with transition to Eliquis. Likely plan for treatment with C/T prior to discharge if stable.  38 minutes of total time was spent for this patient encounter, including preparation, face-to-face counseling with the patient and coordination of care, and documentation of the encounter.  Comer Dollar, MD Division of Gynecologic Oncology Department of Obstetrics and Gynecology [facility]        [1] Past Medical History: Diagnosis Date   Acute respiratory failure due to COVID-19    (CMS-HCC)    Anxiety    Asthma (HHS-HCC)    Dyspareunia in female    Fibromyalgia    GERD (gastroesophageal reflux disease)    Gynecologic malignancy    (CMS-HCC)    Hypothyroidism    IBS  (irritable bowel syndrome)    S/P hysterectomy    for endometriosis   Vaginal dryness   [2] Past Surgical History: Procedure Laterality Date   CHOLECYSTECTOMY     IR INSERT PORT AGE GREATER THAN 5 YRS  11/09/2023   IR INSERT PORT AGE GREATER THAN 5 YRS 11/09/2023 Junette Eden, [place] [facility] [id number]   LAPAROSCOPIC HYSTERECTOMY  1988   patient-reported, for endometriosis   PR ILEOSTOMY/JEJUNOSTOMY,NONTUBE N/A 02/05/2024   Procedure: ILEOSTOMY OR JEJUNOSTOMY, NON-TUBE;  Surgeon: Dollar Comer SAUNDERS, MD;  Location: OR [facility];  Service: Gynecology Oncology   PR LAP,DIAGNOSTIC ABDOMEN N/A 02/05/2024   Procedure: LAPAROSCOPY, ABDOMEN, PERITONEUM, & OMENTUM, DIAGNOSTIC, W/WO COLLECTION SPECIMEN(S) BY BRUSHING OR WASHING;  Surgeon: Dollar Comer SAUNDERS, MD;  Location: OR [facility];  Service: Gynecology Oncology   PR MOBILIZE SPLENIC FLEX N/A 02/05/2024   Procedure: MOBILIZATION (TAKE DOWN) 90F SPLENIC FLEXURE PERFORMED IN CONJUCNTION WITH PARTIAL COLECTOMY;  Surgeon: Dollar Comer SAUNDERS, MD;  Location: OR [facility];  Service: Gynecology Oncology   PR OOPH [facility] DISSECT FOR DEBULKING N/A 02/05/2024   Procedure: RESECTION (INITIAL) OVARIAN, TUBAL/PRIM PERITONEAL MALIG W/BIL S&O/OMENTECT; W/RAD DISSECTION FOR DEBULKING;  Surgeon: Dollar Comer SAUNDERS, MD;  Location: OR [facility];  Service: Gynecology Oncology   PR OPEN EXC/DSTRJ INTRA-ABDL TUMOR/CYST >30 CM N/A 0/76/7974   Procedure: EXCISION OR DESTRUCTION, OPEN, INTRA-ABDOMINAL (IE, PERITONEAL, MESENTERIC, RETROPERITONEAL), PRIMARY OR SECONDARY TUMOR(S) OR CYST(S), SUM OF THE MAX LENGTH OF TUMOR(S) OR CYST(S); GREATER THAN 30 CM;  Surgeon: Wess Almarie Ghazi, MD;  Location: OR [facility];  Service: Surgical Oncology   PR  PART REMOVAL COLON W COLOPROCTOSTOMY N/A 02/05/2024   Procedure: COLECTOMY, PARTIAL; WITH COLOPROCTOSTOMY (LOW PELVIC ANASTOMOSIS);  Surgeon: Viktoria Comer SAUNDERS, MD;  Location: OR [facility];  Service: Gynecology Oncology   PR PART REMV  VESTA DISS PANC-JEJU N/A 02/05/2024   Procedure: PANCREATECTOMY, DISTAL SUBTOTAL, WITH OR WITHOUT SPLENECTOMY; WITH PANCREATICOJEJUNOSTOMY;  Surgeon: Wess Almarie Ghazi, MD;  Location: OR [facility];  Service: Surgical Oncology   PR RELEASE URETER,RETROPER FIBROSIS Left 02/05/2024   Procedure: URETEROLYSIS, WITH OR WITHOUT REPOSITIONING OF URETER FOR RETROPERITONEAL FIBROSIS;  Surgeon: Viktoria Comer SAUNDERS, MD;  Location: OR [facility];  Service: Gynecology Oncology   PR REMVL COLON & TERM ILEUM W/ILEOCOLOSTOMY N/A 02/05/2024   Procedure: COLECTOMY, PARTIAL, WITH REMOVAL OF TERMINAL ILEUM WITH ILEOCOLOSTOMY;  Surgeon: Viktoria Comer SAUNDERS, MD;  Location: OR [facility];  Service: Gynecology Oncology   PR RESEC LIVER,PART LOBECTOMY N/A 02/05/2024   Procedure: HEPATECTOMY, RESECTION OF LIVER; PARTIAL LOBECTOMY;  Surgeon: Wess Almarie Ghazi, MD;  Location: OR [facility];  Service: Surgical Oncology  [3] Family History Problem Relation Age of Onset   Breast cancer Maternal Grandmother    Colon cancer Maternal Grandfather   [4] Social History Socioeconomic History   Marital status: Single    Spouse name: None   Number of children: None   Years of education: None   Highest education level: None  Tobacco Use   Smoking status: Former    Current packs/day: 0.00    Types: Cigarettes    Quit date: 1980    Years since quitting: 45.9   Smokeless tobacco: Never  Substance and Sexual Activity   Alcohol use: Not Currently   Drug use: Never   Social Drivers of Health   Food Insecurity: No Food Insecurity (11/13/2023)   Hunger Vital Sign    Worried About Running Out of Food in the Last Year: Never true    Ran Out of Food in the Last Year: Never true  Tobacco Use: Medium Risk (04/22/2024)   Patient History    Smoking Tobacco Use: Former    Smokeless Tobacco Use: Never  Transportation Needs: No Transportation Needs (11/13/2023)   PRAPARE - Administrator, Civil Service  (Medical): No    Lack of Transportation (Non-Medical): No  Housing: Low Risk (11/13/2023)   Housing    Within the past 12 months, have you ever stayed: outside, in a car, in a tent, in an overnight shelter, or temporarily in someone else's home (i.e. couch-surfing)?: No    Are you worried about losing your housing?: No  Physical Activity: Sufficiently Active (07/19/2023)   Received from [facility]   Exercise Vital Sign    On average, how many days per week do you engage in moderate to strenuous exercise (like a brisk walk)?: 7 days    On average, how many minutes do you engage in exercise at this level?: 30 min  Utilities: Low Risk (11/13/2023)   Utilities    Within the past 12 months, have you been unable to get utilities (heat, electricity) when it was really needed?: No  Stress: No Stress Concern Present (07/19/2023)   Received from [facility] - Occupational Stress Questionnaire    Feeling of Stress : Not at all  Interpersonal Safety: Unknown (04/01/2024)   Interpersonal Safety    Unsafe Where You Currently Live: Patient unable to answer    Physically Hurt by Anyone: No    Abused by Anyone: No  Social Connections: Moderately Integrated (07/19/2023)  Received from [facility]   Social Connection and Isolation Panel    In a typical week, how many times do you talk on the phone with family, friends, or neighbors?: More than three times a week    How often do you get together with friends or relatives?: More than three times a week    How often do you attend church or religious services?: 1 to 4 times per year    Do you belong to any clubs or organizations such as church groups, unions, fraternal or athletic groups, or school groups?: No    How often do you attend meetings of the clubs or organizations you belong to?: Never    Are you married, widowed, divorced, separated, never married, or living with a partner?: Living with partner   Physicist, Medical Strain: Low Risk  (07/19/2023)   Received from [facility]   Overall Financial Resource Strain (CARDIA)    Difficulty of Paying Living Expenses: Not hard at all  [5]  Current Outpatient Medications:    albuterol  HFA 90 mcg/actuation inhaler, Inhale 1 puff every six (6) hours as needed for wheezing., Disp: , Rfl:    cholecalciferol, vitamin D3 25 mcg, 1,000 units,, (CHOLECALCIFEROL-25 MCG, 1,000 UNIT,) 1,000 unit (25 mcg) tablet, Take 1 tablet (25 mcg total) by mouth daily., Disp: , Rfl:    diphenhydrAMINE  (BENADRYL ) 25 mg tablet, Take 2 tablets (50 mg) by mouth 1 hour prior to contrast., Disp: 100 tablet, Rfl: 0   famotidine  (PEPCID ) 20 MG tablet, Take 1 tablet (20 mg total) by mouth two (2) times a day., Disp: , Rfl:    loperamide (IMODIUM) 2 mg capsule, Take 1 capsule (2 mg total) by mouth four (4) times a day as needed for diarrhea., Disp: 90 capsule, Rfl: 2   LORazepam  (ATIVAN ) 1 MG tablet, Take 0.5 tablets (0.5 mg total) by mouth nightly as needed for anxiety., Disp: , Rfl:    polyethylene glycol (GLYCOLAX) 17 gram/dose powder, Take 17 g by mouth daily as needed (constipation)., Disp: 476 g, Rfl: 0   predniSONE (DELTASONE) 50 MG tablet, Take 1 tablet (50 mg) by mouth 13 hours, 7 hours and 1 hour before contrast., Disp: 3 tablet, Rfl: 0   prochlorperazine (COMPAZINE) 10 MG tablet, Take 1 tablet (10 mg total) by mouth every six (6) hours as needed (nausea or vomiting)., Disp: 30 tablet, Rfl: 2   thyroid , porcine (ARMOUR THYROID ) 30 mg Tab, Take 1 tablet (30 mg total) by mouth daily before breakfast., Disp: , Rfl:    valACYclovir  (VALTREX ) 1000 MG tablet, Take 1 tablet (1,000 mg total) by mouth daily as needed., Disp: , Rfl:
